# Patient Record
Sex: Female | Born: 1970 | Race: White | Hispanic: Yes | Marital: Single | Smoking: Never smoker
Health system: Southern US, Community
[De-identification: ages and names within clinical notes are randomized; demographics above are authoritative.]

## PROBLEM LIST (undated history)

## (undated) ENCOUNTER — Inpatient Hospital Stay (HOSPITAL_COMMUNITY): Payer: Self-pay

## (undated) DIAGNOSIS — I10 Essential (primary) hypertension: Secondary | ICD-10-CM

## (undated) DIAGNOSIS — O24419 Gestational diabetes mellitus in pregnancy, unspecified control: Secondary | ICD-10-CM

## (undated) DIAGNOSIS — N938 Other specified abnormal uterine and vaginal bleeding: Secondary | ICD-10-CM

## (undated) HISTORY — DX: Other specified abnormal uterine and vaginal bleeding: N93.8

## (undated) HISTORY — DX: Gestational diabetes mellitus in pregnancy, unspecified control: O24.419

---

## 2000-07-19 ENCOUNTER — Other Ambulatory Visit: Admission: RE | Admit: 2000-07-19 | Discharge: 2000-07-19 | Payer: Self-pay | Admitting: Gynecology

## 2000-12-13 ENCOUNTER — Emergency Department (HOSPITAL_COMMUNITY): Admission: EM | Admit: 2000-12-13 | Discharge: 2000-12-13 | Payer: Self-pay | Admitting: Emergency Medicine

## 2001-04-02 ENCOUNTER — Encounter: Payer: Self-pay | Admitting: Emergency Medicine

## 2001-04-02 ENCOUNTER — Emergency Department (HOSPITAL_COMMUNITY): Admission: EM | Admit: 2001-04-02 | Discharge: 2001-04-02 | Payer: Self-pay | Admitting: Emergency Medicine

## 2001-11-19 ENCOUNTER — Ambulatory Visit (HOSPITAL_COMMUNITY): Admission: RE | Admit: 2001-11-19 | Discharge: 2001-11-19 | Payer: Self-pay | Admitting: *Deleted

## 2001-11-19 ENCOUNTER — Encounter: Payer: Self-pay | Admitting: *Deleted

## 2002-01-03 ENCOUNTER — Ambulatory Visit (HOSPITAL_COMMUNITY): Admission: RE | Admit: 2002-01-03 | Discharge: 2002-01-03 | Payer: Self-pay | Admitting: *Deleted

## 2002-01-03 ENCOUNTER — Encounter: Payer: Self-pay | Admitting: *Deleted

## 2002-02-20 ENCOUNTER — Inpatient Hospital Stay (HOSPITAL_COMMUNITY): Admission: AD | Admit: 2002-02-20 | Discharge: 2002-02-20 | Payer: Self-pay | Admitting: *Deleted

## 2002-02-21 ENCOUNTER — Encounter: Payer: Self-pay | Admitting: *Deleted

## 2002-02-22 ENCOUNTER — Inpatient Hospital Stay (HOSPITAL_COMMUNITY): Admission: AD | Admit: 2002-02-22 | Discharge: 2002-02-25 | Payer: Self-pay | Admitting: *Deleted

## 2002-03-05 ENCOUNTER — Encounter: Admission: RE | Admit: 2002-03-05 | Discharge: 2002-03-05 | Payer: Self-pay | Admitting: *Deleted

## 2002-03-07 ENCOUNTER — Encounter: Admission: RE | Admit: 2002-03-07 | Discharge: 2002-03-07 | Payer: Self-pay | Admitting: *Deleted

## 2002-03-12 ENCOUNTER — Encounter: Admission: RE | Admit: 2002-03-12 | Discharge: 2002-03-12 | Payer: Self-pay | Admitting: *Deleted

## 2002-03-19 ENCOUNTER — Encounter: Admission: RE | Admit: 2002-03-19 | Discharge: 2002-03-19 | Payer: Self-pay | Admitting: *Deleted

## 2002-03-24 ENCOUNTER — Ambulatory Visit (HOSPITAL_COMMUNITY): Admission: RE | Admit: 2002-03-24 | Discharge: 2002-03-24 | Payer: Self-pay | Admitting: *Deleted

## 2002-03-26 ENCOUNTER — Encounter: Admission: RE | Admit: 2002-03-26 | Discharge: 2002-03-26 | Payer: Self-pay | Admitting: *Deleted

## 2002-04-02 ENCOUNTER — Encounter: Admission: RE | Admit: 2002-04-02 | Discharge: 2002-04-02 | Payer: Self-pay | Admitting: *Deleted

## 2002-04-09 ENCOUNTER — Encounter: Admission: RE | Admit: 2002-04-09 | Discharge: 2002-04-09 | Payer: Self-pay | Admitting: *Deleted

## 2002-04-16 ENCOUNTER — Encounter: Admission: RE | Admit: 2002-04-16 | Discharge: 2002-04-16 | Payer: Self-pay | Admitting: *Deleted

## 2002-04-23 ENCOUNTER — Encounter: Admission: RE | Admit: 2002-04-23 | Discharge: 2002-04-23 | Payer: Self-pay | Admitting: *Deleted

## 2002-04-30 ENCOUNTER — Encounter: Admission: RE | Admit: 2002-04-30 | Discharge: 2002-04-30 | Payer: Self-pay | Admitting: *Deleted

## 2002-05-07 ENCOUNTER — Encounter: Admission: RE | Admit: 2002-05-07 | Discharge: 2002-05-07 | Payer: Self-pay | Admitting: *Deleted

## 2002-05-13 ENCOUNTER — Inpatient Hospital Stay (HOSPITAL_COMMUNITY): Admission: AD | Admit: 2002-05-13 | Discharge: 2002-05-16 | Payer: Self-pay | Admitting: *Deleted

## 2004-08-07 ENCOUNTER — Inpatient Hospital Stay (HOSPITAL_COMMUNITY): Admission: AD | Admit: 2004-08-07 | Discharge: 2004-08-07 | Payer: Self-pay | Admitting: Family Medicine

## 2006-01-07 ENCOUNTER — Emergency Department (HOSPITAL_COMMUNITY): Admission: EM | Admit: 2006-01-07 | Discharge: 2006-01-07 | Payer: Self-pay

## 2006-01-08 ENCOUNTER — Emergency Department (HOSPITAL_COMMUNITY): Admission: EM | Admit: 2006-01-08 | Discharge: 2006-01-08 | Payer: Self-pay | Admitting: Emergency Medicine

## 2007-08-18 ENCOUNTER — Emergency Department (HOSPITAL_COMMUNITY): Admission: EM | Admit: 2007-08-18 | Discharge: 2007-08-18 | Payer: Self-pay | Admitting: Emergency Medicine

## 2007-08-20 ENCOUNTER — Inpatient Hospital Stay (HOSPITAL_COMMUNITY): Admission: AD | Admit: 2007-08-20 | Discharge: 2007-08-20 | Payer: Self-pay | Admitting: Obstetrics and Gynecology

## 2007-12-06 ENCOUNTER — Ambulatory Visit: Payer: Self-pay | Admitting: Internal Medicine

## 2007-12-09 ENCOUNTER — Ambulatory Visit: Payer: Self-pay | Admitting: *Deleted

## 2008-01-09 ENCOUNTER — Encounter: Payer: Self-pay | Admitting: Family Medicine

## 2008-01-09 ENCOUNTER — Ambulatory Visit: Payer: Self-pay | Admitting: Internal Medicine

## 2008-01-09 LAB — CONVERTED CEMR LAB
ALT: 18 units/L (ref 0–35)
Albumin: 4.2 g/dL (ref 3.5–5.2)
Alkaline Phosphatase: 64 units/L (ref 39–117)
BUN: 14 mg/dL (ref 6–23)
Basophils Absolute: 0 10*3/uL (ref 0.0–0.1)
CO2: 25 meq/L (ref 19–32)
Calcium: 9.1 mg/dL (ref 8.4–10.5)
Cholesterol: 189 mg/dL (ref 0–200)
Creatinine, Ser: 0.54 mg/dL (ref 0.40–1.20)
Eosinophils Relative: 2 % (ref 0–5)
Glucose, Bld: 101 mg/dL — ABNORMAL HIGH (ref 70–99)
Lymphs Abs: 2 10*3/uL (ref 0.7–4.0)
Monocytes Relative: 6 % (ref 3–12)
Neutro Abs: 3.4 10*3/uL (ref 1.7–7.7)
Neutrophils Relative %: 58 % (ref 43–77)
Potassium: 4.2 meq/L (ref 3.5–5.3)
RBC: 4.52 M/uL (ref 3.87–5.11)
RDW: 13.6 % (ref 11.5–15.5)
TSH: 1.482 microintl units/mL (ref 0.350–5.50)
Total Bilirubin: 0.5 mg/dL (ref 0.3–1.2)
Total Protein: 8 g/dL (ref 6.0–8.3)

## 2008-01-17 ENCOUNTER — Ambulatory Visit: Payer: Self-pay | Admitting: Internal Medicine

## 2008-01-17 ENCOUNTER — Encounter: Payer: Self-pay | Admitting: Family Medicine

## 2008-01-17 LAB — CONVERTED CEMR LAB: Chlamydia, DNA Probe: NEGATIVE

## 2008-01-18 ENCOUNTER — Encounter: Payer: Self-pay | Admitting: Family Medicine

## 2008-02-19 ENCOUNTER — Ambulatory Visit: Payer: Self-pay | Admitting: Family Medicine

## 2008-02-19 ENCOUNTER — Ambulatory Visit: Payer: Self-pay | Admitting: Internal Medicine

## 2008-02-19 LAB — CONVERTED CEMR LAB: Progesterone: 0.8 ng/mL

## 2008-11-23 ENCOUNTER — Inpatient Hospital Stay (HOSPITAL_COMMUNITY): Admission: AD | Admit: 2008-11-23 | Discharge: 2008-11-23 | Payer: Self-pay | Admitting: Family Medicine

## 2009-01-18 IMAGING — US US OB TRANSVAGINAL MODIFY
1 series · 13 of 28 positions shown · non-contrast
Comparison: 08/07/2004

CLINICAL DATA: Abdominal pain. Bleeding.

OBSTETRICAL ULTRASOUND <14 WK TRANSABDOMINAL AND TRANSVAGINAL OB:
TECHNIQUE: Both transabdominal and transvaginal ultrasound examinations were
performed for complete evaluation of the gestation as well as the maternal
uterus, adnexal regions, and pelvic cul-de-sac.

[Series 1: unknown · 0.25mm/px · 13 of 38 slices shown]
[im 2/38]
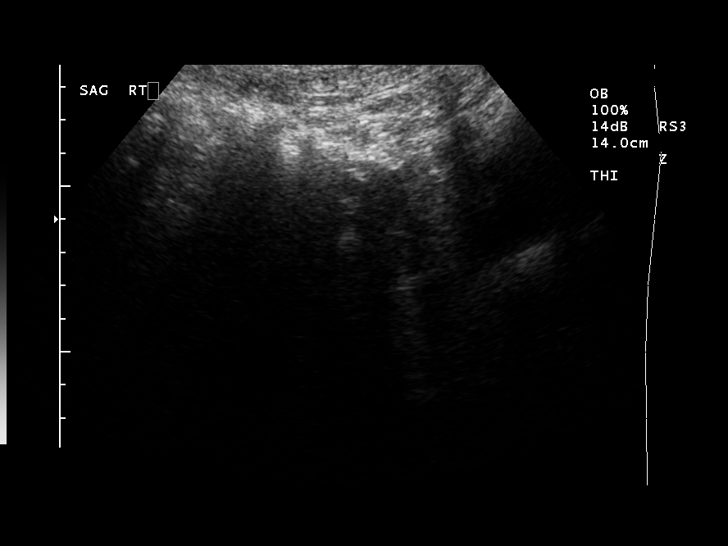
[im 5/38]
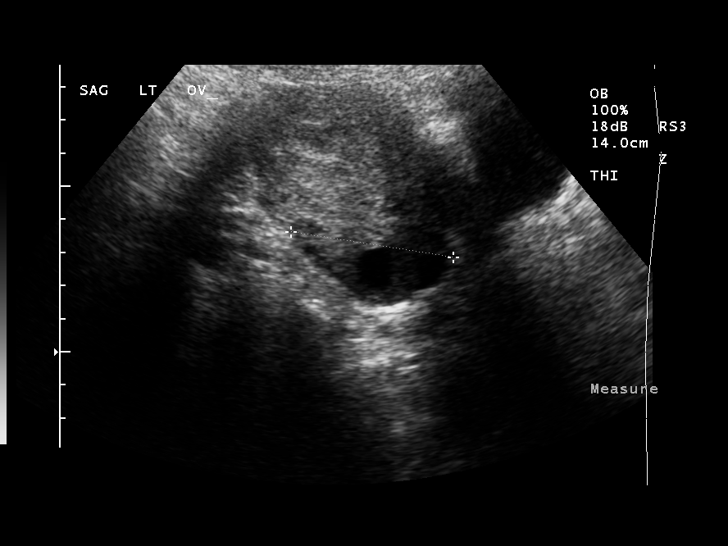
[im 7/38]
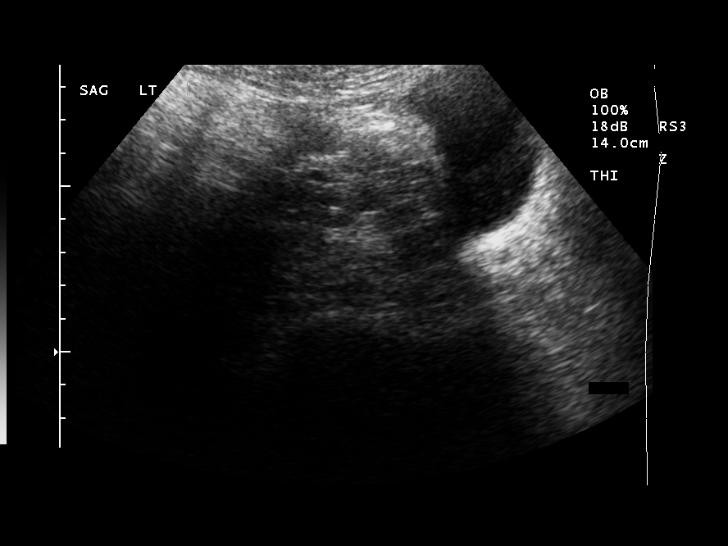
[im 10/38]
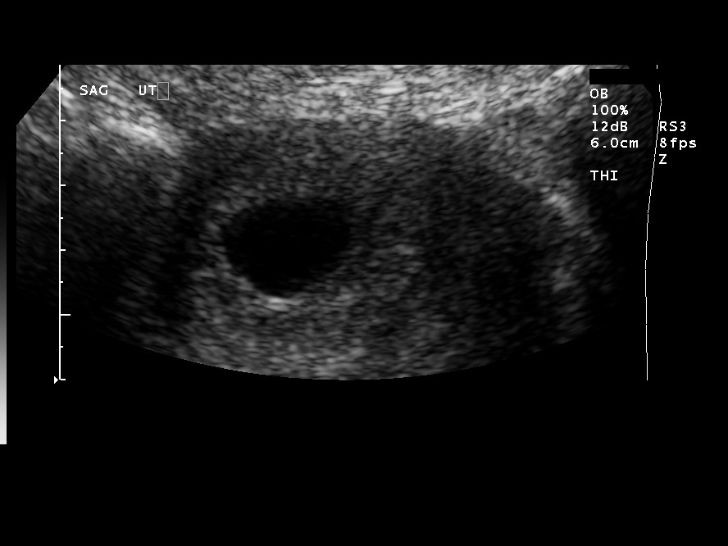
[im 13/38]
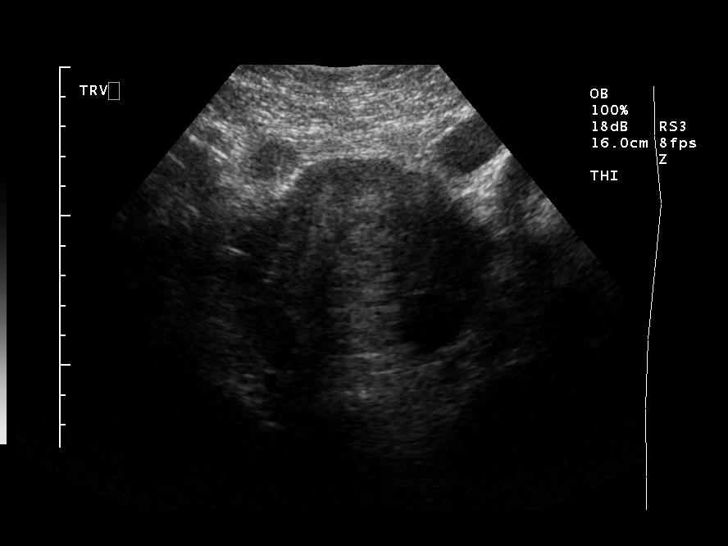
[im 16/38]
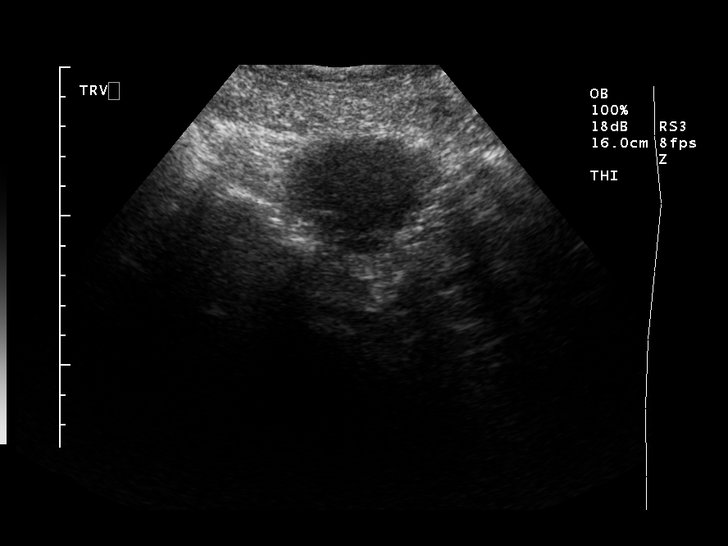
[im 20/38]
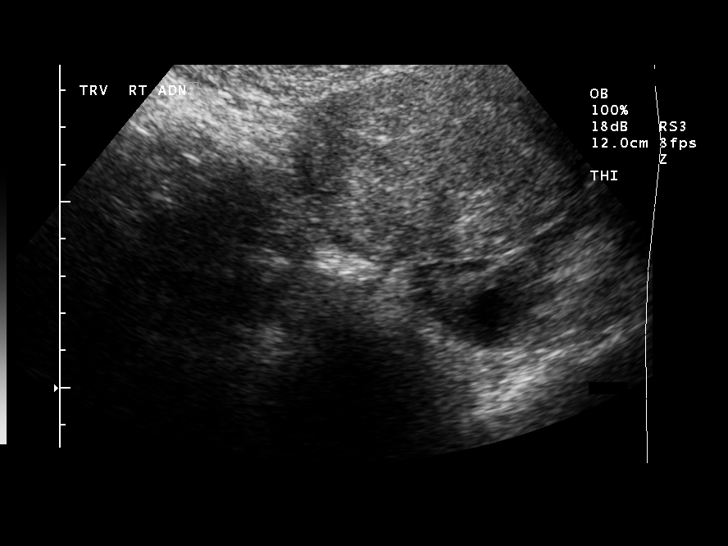
[im 22/38]
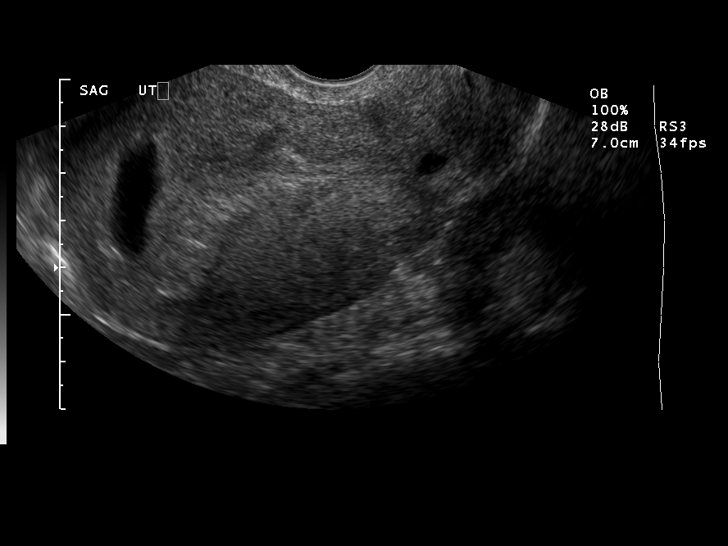
[im 25/38]
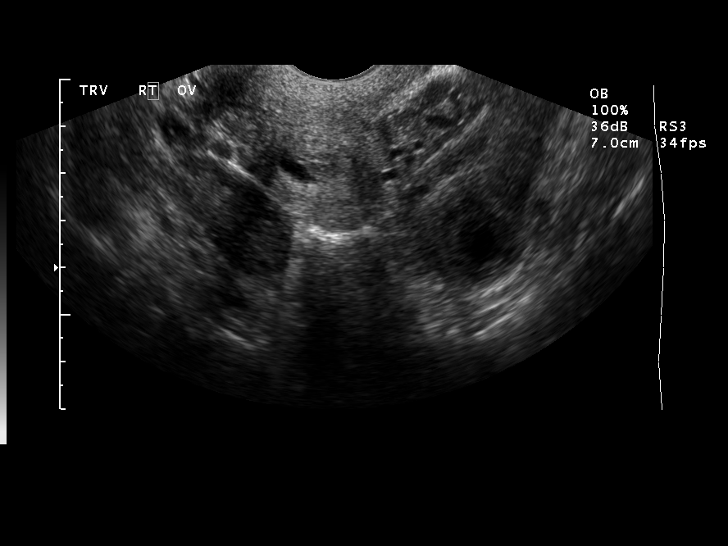
[im 28/38]
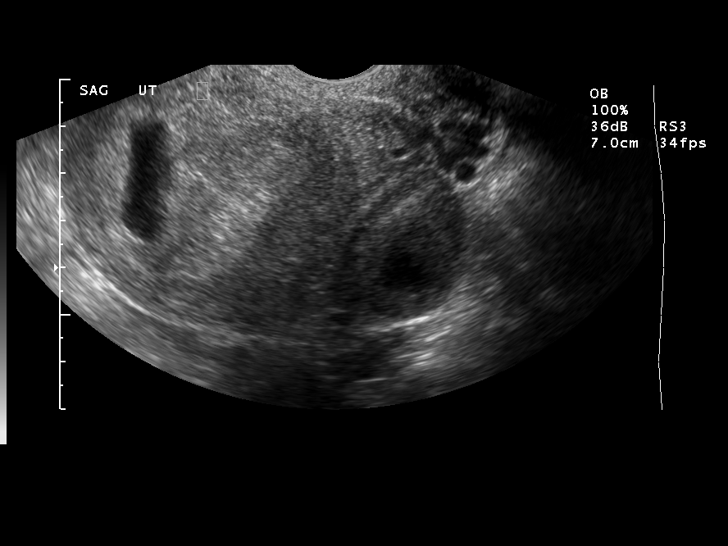
[im 31/38]
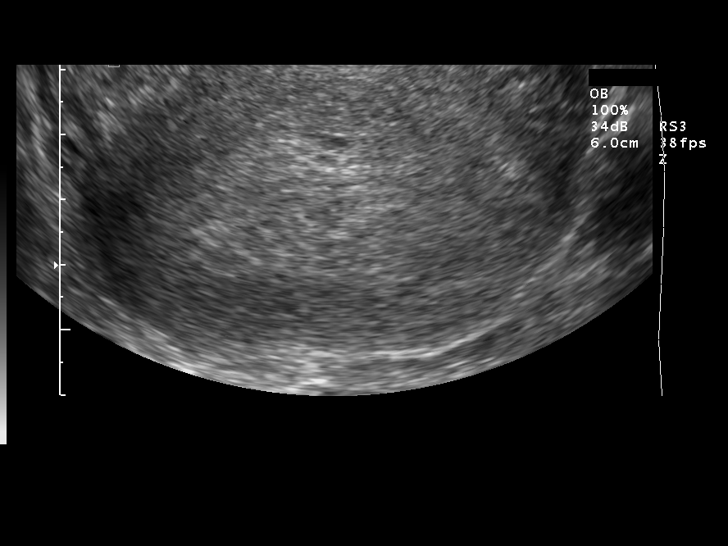
[im 33/38]
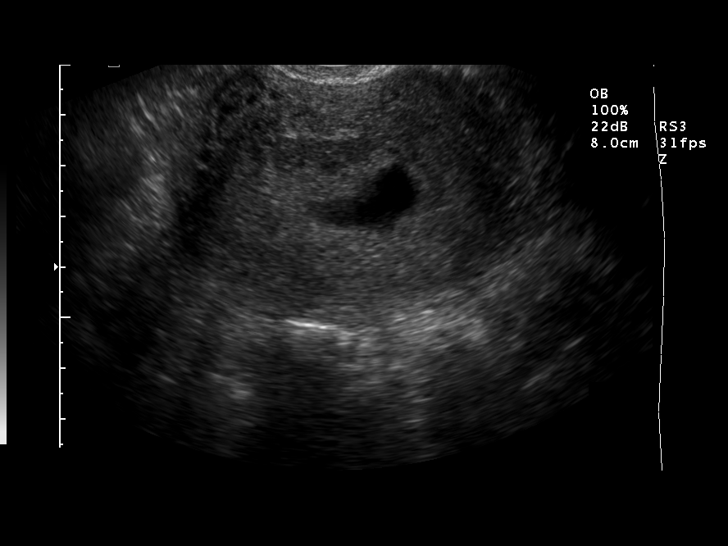
[im 36/38]
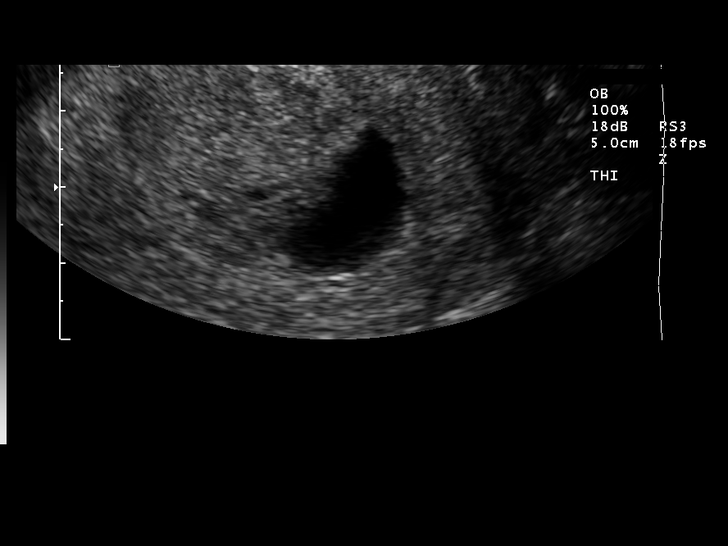

[13 of 28 positions shown; findings below may reference images not displayed]

FINDINGS: A fluid collection is identified within the uterus. This measures
mean of 23 mm. No evidence of yolk sac, embryo, or cardiac activity.

No subchorionic hemorrhage.

Right ovary normal in morphology. Left ovary demonstrates a 1.4 cm minimally
complex cystic lesion.

No significant free fluid.
IMPRESSION: 1.  23 millimeters mean diameter fluid collection within the endometrium. This
could represent a abnormal gestational sac or a incidental endometrial fluid
collection (pseudogestational sac).   This appearance is highly dependent on the
beta-hCG level, which is not submitted. If the beta-hCG level is greater than
4333, this is suspicious for either a spontaneous abortion or a
"pseudogestational sac" with concurrent ectopic pregnancy. If the beta-hCG level
is less than 4333, differential considerations would include a early IUP (with
the fluid being incidental), early ectopic, or spontaneous abortion.
2. Complex follicle versus small corpus luteal cyst in left ovary. 
3. no significant free fluid.
4. Recommend correlation with beta-hCG level and consider followup ultrasound
and/or beta-hCG.

## 2009-03-13 ENCOUNTER — Inpatient Hospital Stay (HOSPITAL_COMMUNITY): Admission: AD | Admit: 2009-03-13 | Discharge: 2009-03-14 | Payer: Self-pay | Admitting: Obstetrics

## 2009-03-18 ENCOUNTER — Inpatient Hospital Stay (HOSPITAL_COMMUNITY): Admission: AD | Admit: 2009-03-18 | Discharge: 2009-03-20 | Payer: Self-pay | Admitting: Obstetrics and Gynecology

## 2009-11-21 ENCOUNTER — Emergency Department (HOSPITAL_COMMUNITY): Admission: EM | Admit: 2009-11-21 | Discharge: 2009-11-21 | Payer: Self-pay | Admitting: Emergency Medicine

## 2010-12-04 LAB — URINALYSIS, ROUTINE W REFLEX MICROSCOPIC
Bilirubin Urine: NEGATIVE
Protein, ur: NEGATIVE mg/dL
pH: 6 (ref 5.0–8.0)

## 2010-12-04 LAB — POCT PREGNANCY, URINE: Preg Test, Ur: NEGATIVE

## 2010-12-18 LAB — CBC
HCT: 28.3 % — ABNORMAL LOW (ref 36.0–46.0)
HCT: 34.1 % — ABNORMAL LOW (ref 36.0–46.0)
Hemoglobin: 11.6 g/dL — ABNORMAL LOW (ref 12.0–15.0)
Hemoglobin: 9.8 g/dL — ABNORMAL LOW (ref 12.0–15.0)
MCHC: 34.1 g/dL (ref 30.0–36.0)
MCHC: 34.5 g/dL (ref 30.0–36.0)
MCV: 87.3 fL (ref 78.0–100.0)
MCV: 87.9 fL (ref 78.0–100.0)
Platelets: 243 10*3/uL (ref 150–400)
Platelets: 296 10*3/uL (ref 150–400)
RBC: 3.22 MIL/uL — ABNORMAL LOW (ref 3.87–5.11)
RBC: 3.91 MIL/uL (ref 3.87–5.11)
RDW: 14.2 % (ref 11.5–15.5)
RDW: 14.5 % (ref 11.5–15.5)
WBC: 7.9 10*3/uL (ref 4.0–10.5)
WBC: 9.7 10*3/uL (ref 4.0–10.5)

## 2010-12-18 LAB — RPR: RPR Ser Ql: NONREACTIVE

## 2010-12-18 LAB — CCBB MATERNAL DONOR DRAW

## 2010-12-22 LAB — HIV ANTIBODY (ROUTINE TESTING W REFLEX): HIV: NONREACTIVE

## 2010-12-22 LAB — RPR: RPR Ser Ql: NONREACTIVE

## 2010-12-22 LAB — URINALYSIS, ROUTINE W REFLEX MICROSCOPIC
Bilirubin Urine: NEGATIVE
Glucose, UA: NEGATIVE mg/dL
Hgb urine dipstick: NEGATIVE
Ketones, ur: NEGATIVE mg/dL
Protein, ur: NEGATIVE mg/dL
pH: 6.5 (ref 5.0–8.0)

## 2010-12-22 LAB — TYPE AND SCREEN
ABO/RH(D): B POS
Antibody Screen: NEGATIVE

## 2010-12-22 LAB — DIFFERENTIAL
Basophils Relative: 0 % (ref 0–1)
Eosinophils Relative: 0 % (ref 0–5)
Lymphocytes Relative: 16 % (ref 12–46)
Lymphs Abs: 1 10*3/uL (ref 0.7–4.0)
Monocytes Absolute: 0.3 10*3/uL (ref 0.1–1.0)
Neutrophils Relative %: 79 % — ABNORMAL HIGH (ref 43–77)

## 2010-12-22 LAB — CBC
HCT: 32.9 % — ABNORMAL LOW (ref 36.0–46.0)
Platelets: 302 10*3/uL (ref 150–400)
RDW: 13.1 % (ref 11.5–15.5)
WBC: 6.3 10*3/uL (ref 4.0–10.5)

## 2010-12-22 LAB — SICKLE CELL SCREEN: Sickle Cell Screen: NEGATIVE

## 2011-01-27 NOTE — Discharge Summary (Signed)
[facility]  Patient:    Elizabeth Mora, Elizabeth Mora Visit Number: [id number] MRN: [id number]          Service Type: OBS Location: [id number] Attending Physician:  Michaelle Copas Dictated by:   Conni Elliot, M.D. Admit Date:  02/21/2002 Discharge Date: 02/25/2002                             Discharge Summary  HISTORY OF PRESENT ILLNESS:  The patient is a 40 year old, gravida 1, para 0, at [redacted] weeks gestation.  The patient is dated by a 14 week ultrasound, giving her a due date of May 15, 2002.  Her last menstrual period is unavailable.  The patient had ultrasound findings on February 20, 2002, and the patient was asked to return for followup.  ADMISSION PHYSICAL EXAMINATION:  VITAL SIGNS:  Temperature 98, pulse 74, respiratory rate 18, blood pressure 121/74.  GENERAL:  In no acute distress.  HEART:  Normal sinus rhythm.  LUNGS:  Clear.  PELVIC:  Fundal height 31.  Cervical exam:  Soft, posterior, external os 1, internal fingertip to close, approximately 2.5 cm long, -3 vertex.  NEUROLOGIC:  Deep tendon reflexes are 1+.  HOSPITAL COURSE:  The patient was admitted with threatened preterm labor due to short cervix and possible gestational diabetes.  The patient was brought in for IV antibiotics and glycemic control.  The patient was begun on Procardia p.o. 10 mg q.8h.  The patient was placed on an ADA diet, and blood sugars were well controlled.  The patient was felt to be ready for discharge on p.o. Procardia and ADA diet.  FOLLOWUP:  The patient is to follow up on March 05, 2002.  DIET:  Continue her ADA diet.Dictated by:   Conni Elliot, M.D. Attending Physician:  Michaelle Copas DD:  03/25/02 TD:  03/28/02 Job: [id number]

## 2011-05-29 ENCOUNTER — Emergency Department (HOSPITAL_COMMUNITY)
Admission: EM | Admit: 2011-05-29 | Discharge: 2011-05-29 | Disposition: A | Discharge status: Home / Self Care | Payer: Self-pay | Attending: Emergency Medicine | Admitting: Emergency Medicine

## 2011-05-29 ENCOUNTER — Emergency Department (HOSPITAL_COMMUNITY): Payer: Self-pay

## 2011-05-29 DIAGNOSIS — R109 Unspecified abdominal pain: Secondary | ICD-10-CM | POA: Insufficient documentation

## 2011-05-29 DIAGNOSIS — N898 Other specified noninflammatory disorders of vagina: Secondary | ICD-10-CM | POA: Insufficient documentation

## 2011-05-29 DIAGNOSIS — R509 Fever, unspecified: Secondary | ICD-10-CM | POA: Insufficient documentation

## 2011-05-29 LAB — COMPREHENSIVE METABOLIC PANEL
AST: 10 U/L (ref 0–37)
Albumin: 4.4 g/dL (ref 3.5–5.2)
Calcium: 9.3 mg/dL (ref 8.4–10.5)
Chloride: 105 mEq/L (ref 96–112)
Creatinine, Ser: 0.51 mg/dL (ref 0.50–1.10)
Total Protein: 8.2 g/dL (ref 6.0–8.3)

## 2011-05-29 LAB — URINALYSIS, ROUTINE W REFLEX MICROSCOPIC
Bilirubin Urine: NEGATIVE
Glucose, UA: 100 mg/dL — AB
Nitrite: NEGATIVE
Specific Gravity, Urine: 1.026 (ref 1.005–1.030)
pH: 5 (ref 5.0–8.0)

## 2011-05-29 LAB — WET PREP, GENITAL
Trich, Wet Prep: NONE SEEN
Yeast Wet Prep HPF POC: NONE SEEN

## 2011-05-29 LAB — CBC
MCH: 29.8 pg (ref 26.0–34.0)
MCV: 86 fL (ref 78.0–100.0)
Platelets: 321 10*3/uL (ref 150–400)
RDW: 12.7 % (ref 11.5–15.5)
WBC: 6.2 10*3/uL (ref 4.0–10.5)

## 2011-05-29 LAB — DIFFERENTIAL
Eosinophils Absolute: 0 10*3/uL (ref 0.0–0.7)
Eosinophils Relative: 1 % (ref 0–5)
Lymphs Abs: 1.4 10*3/uL (ref 0.7–4.0)

## 2011-05-29 LAB — POCT PREGNANCY, URINE: Preg Test, Ur: NEGATIVE

## 2011-05-29 LAB — URINE MICROSCOPIC-ADD ON

## 2011-05-30 LAB — GC/CHLAMYDIA PROBE AMP, GENITAL: GC Probe Amp, Genital: NEGATIVE

## 2011-06-19 LAB — WET PREP, GENITAL
Trich, Wet Prep: NONE SEEN
Yeast Wet Prep HPF POC: NONE SEEN

## 2011-06-19 LAB — URINE MICROSCOPIC-ADD ON

## 2011-06-19 LAB — URINALYSIS, ROUTINE W REFLEX MICROSCOPIC
Bilirubin Urine: NEGATIVE
Glucose, UA: NEGATIVE
Ketones, ur: NEGATIVE
Nitrite: NEGATIVE
Protein, ur: NEGATIVE
Specific Gravity, Urine: 1.026
Urobilinogen, UA: 0.2
pH: 6

## 2011-06-19 LAB — BASIC METABOLIC PANEL
BUN: 11
CO2: 24
Calcium: 9.3
Chloride: 108
Creatinine, Ser: 0.5
GFR calc Af Amer: >60
GFR calc non Af Amer: >60
Glucose, Bld: 124 — ABNORMAL HIGH
Potassium: 3.8
Sodium: 138

## 2011-06-19 LAB — CBC
HCT: 37.3
Hemoglobin: 12.6
MCHC: 33.7
MCV: 89.9
Platelets: 343
RBC: 4.15
RDW: 13.7
WBC: 7.3

## 2011-06-19 LAB — DIFFERENTIAL
Basophils Absolute: 0
Basophils Relative: 1
Eosinophils Absolute: 0.1 — ABNORMAL LOW
Eosinophils Relative: 2
Lymphocytes Relative: 30
Lymphs Abs: 2.2
Monocytes Absolute: 0.5
Monocytes Relative: 7
Neutro Abs: 4.4
Neutrophils Relative %: 61

## 2011-06-19 LAB — HCG, QUANTITATIVE, PREGNANCY: hCG, Beta Chain, Quant, S: 17653 — ABNORMAL HIGH

## 2011-06-19 LAB — GC/CHLAMYDIA PROBE AMP, GENITAL: GC Probe Amp, Genital: NEGATIVE

## 2011-06-29 ENCOUNTER — Ambulatory Visit (INDEPENDENT_AMBULATORY_CARE_PROVIDER_SITE_OTHER): Payer: Self-pay | Admitting: Advanced Practice Midwife

## 2011-06-29 ENCOUNTER — Encounter: Payer: Self-pay | Admitting: Advanced Practice Midwife

## 2011-06-29 ENCOUNTER — Other Ambulatory Visit (HOSPITAL_COMMUNITY)
Admission: RE | Admit: 2011-06-29 | Discharge: 2011-06-29 | Disposition: A | Discharge status: Home / Self Care | Payer: Self-pay | Source: Ambulatory Visit | Attending: Advanced Practice Midwife | Admitting: Advanced Practice Midwife

## 2011-06-29 DIAGNOSIS — N921 Excessive and frequent menstruation with irregular cycle: Secondary | ICD-10-CM

## 2011-06-29 DIAGNOSIS — N915 Oligomenorrhea, unspecified: Secondary | ICD-10-CM | POA: Insufficient documentation

## 2011-06-29 DIAGNOSIS — Z01419 Encounter for gynecological examination (general) (routine) without abnormal findings: Secondary | ICD-10-CM

## 2011-06-29 DIAGNOSIS — N920 Excessive and frequent menstruation with regular cycle: Secondary | ICD-10-CM

## 2011-06-29 LAB — POCT PREGNANCY, URINE: Preg Test, Ur: NEGATIVE

## 2011-06-29 MED ORDER — NORGESTIM-ETH ESTRAD TRIPHASIC 0.18/0.215/0.25 MG-25 MCG PO TABS: 1.0000 | ORAL_TABLET | Freq: Every day | ORAL | Status: DC

## 2011-06-29 NOTE — Patient Instructions (Signed)
Place abnormal uterine bleeding patient instructions here. Biopsia - Cuidados posteriores  Biopsy, Care After  Siga estas instrucciones durante las prximas semanas. Estas indicaciones le proporcionan informacin general acerca de cmo deber cuidarse despus del procedimiento. El mdico tambin podr darle instrucciones especficas. El tratamiento ha sido planificado segn las prcticas mdicas actuales, pero en algunos casos pueden ocurrir problemas. Comunquese con el mdico si tiene algn problema o tiene preguntas despus del procedimiento. Si le han realizado una biopsia con aguja fina, podrn sentir dolor en el sitio durante 1  2 das. Si le han realizado una biopsia abierta, podrn sentir dolor en el sitio durante 3  4 das.  INSTRUCCIONES PARA EL CUIDADO EN EL HOGAR   Puede reanudar su dieta y sus actividades normales segn se le haya indicado.   Cambie el vendaje tal como se le indic. Si la herida fue cerrada con pegamento para la piel Turner Daniels), comenzar a desprenderse luego de 7 das   Solo tome medicamentos que se pueden comprar sin Engineer, drilling o recetados para Chief Technology Officer, Dentist o fiebre, como le indica el mdico.   Consulte a su mdico cundo puede baarse y English as a second language teacher la herida.  SOLICITE ATENCIN MDICA DE INMEDIATO SI:   Aumenta el sangrado (ms de una pequea mancha) en el lugar de la biopsia.   Presenta enrojecimiento, hinchazn o aumento del dolor en la herida.   Tiene pus en el sitio de la biopsia.   Tiene fiebre.   Advierte un olor ftido que proviene del sitio de la biopsia o del vendaje.   Presenta una erupcin cutnea, siente dificultad para respirar o tiene algn otro problema de alergia.  ASEGRESE DE QUE:   Comprende estas instrucciones.   Controlar su enfermedad.   Solicitar ayuda de inmediato si no mejora o si empeora.  Document Released: 05/02/2011 Document Revised: 05/10/2011 [facility] Patient Information 2012 [place], [place].

## 2011-06-29 NOTE — Progress Notes (Signed)
  Subjective:    Patient ID: Elizabeth Mora, female    DOB: 06-12-1971, 40 y.o.   MRN: [id number]  HPI: Oligomenorrhea since menarche, approximately 2 cycles per year. New onset menorrhagia 05/29/11. Seen in ED for menorrhagia and abd pain (resolved). CT normal except for few diverticula. No US done. GC/CT, wet prep neg.  Lab Results  Component Value Date   WBC 6.2 05/29/2011   HGB 13.4 05/29/2011   HCT 38.6 05/29/2011   MCV 86.0 05/29/2011   PLT 321 05/29/2011     Here today for concern over new onset menorrhagia desire for cycle regulation. Last Pap ~ 1-2 years ago per pt.  Review of Systems: Otherwise neg. Denies intermenstrual spotting.     Objective:   Physical Exam:  Abd: soft, NT, BS + x 4 Pelvic: No inguinal nodes palpated. BUS normal, vaginal pink, well-rugated, no lesions. Cervix rliable, no discharge. Uterus normal size, NT, no CMT or adnexal masses.  Procedure: Endometrial Biopsy  Patient given informed consent, signed copy in the chart, time out was performed. Appropriate time out taken. . The patient was placed in the lithotomy position and the cervix brought into view with sterile speculum.  Portio of cervix cleansed x 2 with betadine swabs.  A tenaculum was placed in the anterior lip of the cervix.  The uterus was sounded for depth of 8 cm. A pipelle was introduced to into the uterus, suction created,  and an endometrial sample was obtained. All equipment was removed and accounted for.  The patient tolerated the procedure well. The was bleeding at the right insertion site of the tenaculum that resolved w/ application of silver nitrate. She declined ibuprofen after the procedure.    Patient given post procedure instructions. The patient will return in 3-4 weeks for results.     Assessment & Plan:  Assessment: 1. Oligomenorrhea w/ new-onset menorrhagia 2. Desires cycle control 3. Annual Gyn exam   1. Pap 2. Endometrial Biopsy 3. Will schedule pelvic US 4. F/U in  Gyn clinic for results in 3-4 weeks 5. May start OCPs for cycle regulation if biopsy results normal  Dorathy Kinsman 06/29/2011 5:53 PM

## 2011-06-29 NOTE — Progress Notes (Signed)
C/o irregular periods for a long time, went to [facility] ER 05/29/11 because heavy bleeding. C/o bad vaginal odor and yellowish vaginal discharge after intercourse

## 2011-07-05 ENCOUNTER — Other Ambulatory Visit: Payer: Self-pay | Admitting: Advanced Practice Midwife

## 2011-07-05 ENCOUNTER — Telehealth: Payer: Self-pay | Admitting: *Deleted

## 2011-07-05 DIAGNOSIS — N921 Excessive and frequent menstruation with irregular cycle: Secondary | ICD-10-CM

## 2011-07-05 DIAGNOSIS — N915 Oligomenorrhea, unspecified: Secondary | ICD-10-CM

## 2011-07-05 NOTE — Telephone Encounter (Signed)
Message copied by Mannie Stabile on Wed Jul 05, 2011  9:50 AM ------      Message from: Darrel Hoover      Created: Fri Jun 30, 2011  2:13 PM      Regarding: Please schedule                   ----- Message -----         From: Dorathy Kinsman, CNM         Sent: 06/30/2011   2:04 PM           To: Juliette Mangle, RN            Pt needs to schedule pelvic US for DUB, new onset of menorrhagia in next 1-2 weeks and F/U results visit for [place], Pap and endo Bx in ~ 4 weeks.

## 2011-07-05 NOTE — Telephone Encounter (Signed)
Patient scheduled for ultrasound on 07/11/11 at 2:30 and an appt for results on 08/09/11 at 12:45 in clinic. Delorise Royals called patient and left her a message to return our call about appointments. Need to make patient aware of these appointments.

## 2011-07-10 NOTE — Telephone Encounter (Signed)
Called patient with Interpreter Albertina [place] and notified of appointment for [place] and Results. Pt. Voices understanding

## 2011-07-11 ENCOUNTER — Ambulatory Visit (HOSPITAL_COMMUNITY)
Admission: RE | Admit: 2011-07-11 | Discharge: 2011-07-11 | Disposition: A | Discharge status: Home / Self Care | Payer: Self-pay | Source: Ambulatory Visit | Attending: Advanced Practice Midwife | Admitting: Advanced Practice Midwife

## 2011-07-11 DIAGNOSIS — N915 Oligomenorrhea, unspecified: Secondary | ICD-10-CM

## 2011-07-11 DIAGNOSIS — N921 Excessive and frequent menstruation with irregular cycle: Secondary | ICD-10-CM

## 2011-07-11 DIAGNOSIS — N938 Other specified abnormal uterine and vaginal bleeding: Secondary | ICD-10-CM | POA: Insufficient documentation

## 2011-07-11 DIAGNOSIS — N949 Unspecified condition associated with female genital organs and menstrual cycle: Secondary | ICD-10-CM | POA: Insufficient documentation

## 2011-08-09 ENCOUNTER — Encounter: Payer: Self-pay | Admitting: Physician Assistant

## 2011-08-09 ENCOUNTER — Ambulatory Visit (INDEPENDENT_AMBULATORY_CARE_PROVIDER_SITE_OTHER): Payer: Self-pay | Admitting: Physician Assistant

## 2011-08-09 DIAGNOSIS — N926 Irregular menstruation, unspecified: Secondary | ICD-10-CM

## 2011-08-09 NOTE — Progress Notes (Signed)
Chief Complaint:  Results   Elizabeth Mora is  40 y.o. G3P2010.  Patient's last menstrual period was 08/07/2011. Pt with no complaints. Reports regular periods on OCPs. Describes flow as medium to light lasting 3-5 days without cramping.  States desiring another pregnancy within this year. Reports "took a long time" to get pregnant with other children, ages 2 and 5 years. Requesting infertility assistance.    Obstetrical/Gynecological History: OB History    Grav Para Term Preterm Abortions TAB SAB Ect Mult Living   3 2 2  1  1          Past Medical History: Past Medical History  Diagnosis Date  . DUB (dysfunctional uterine bleeding)   . Gestational diabetes     Past Surgical History: No past surgical history on file.  Family History: No family history on file.  Social History: History  Substance Use Topics  . Smoking status: Never Smoker   . Smokeless tobacco: Never Used  . Alcohol Use: No    Allergies: No Known Allergies   (Not in a hospital admission)  Review of Systems - Negative except what has been reviewed in HPI  Physical Exam   Blood pressure 126/82, pulse 73, temperature 97.1 F (36.2 C), temperature source Oral, height 5\' 3"  (1.6 m), weight 150 lb 14.4 oz (68.448 kg), last menstrual period 08/07/2011.  General: General appearance - alert, well appearing, and in no distress, oriented to person, place, and time and normal appearing weight Mental status - alert, oriented to person, place, and time, normal mood, behavior, speech, dress, motor activity, and thought processes, affect appropriate to mood Focused Gynecological Exam: examination not indicated  Labs: NL endo Bx result  Imaging Studies:   US Pelvis Complete  07/11/2011  *RADIOLOGY REPORT*  Clinical Data: Dysfunctional uterine bleeding  TRANSABDOMINAL AND TRANSVAGINAL ULTRASOUND OF PELVIS Technique:  Both transabdominal and transvaginal ultrasound examinations of the pelvis were  performed. Transabdominal technique was performed for global imaging of the pelvis including uterus, ovaries, adnexal regions, and pelvic cul-de-sac.  Comparison: August 07, 2004   It was necessary to proceed with endovaginal exam following the transabdominal exam to visualize the endometrium.  Findings: The uterus is normal in size and echotexture, measuring 8.8 x 4.0 x 5.8 cm.  Endometrial stripe is thin and homogeneous, measuring 7 mm in width.  Both ovaries have a normal size and appearance.  The right ovary measures 3.6 x 2.1 x 2.2 cm, and the left ovary measures 5.0 x 2.2 x 2.5 cm.  There are no adnexal masses or free pelvic fluid.  IMPRESSION: Normal pelvic ultrasound.  Original Report Authenticated By: Brandon Melnick, M.D.     Assessment: Improved Status on OCPs Patient Active Problem List  Diagnoses  . Oligomenorrhea  . Menorrhagia with irregular cycle  Pregnancy Desired  Plan: Discussed pre-conception Stop OCPs if desiring pregnancy Recommend PNV daily If no conception in 3-6 months after stopping pills RTC or referral to repro-endo for evaluation.  Kao Berkheimer E. 08/09/2011,1:43 PM

## 2011-08-09 NOTE — Patient Instructions (Signed)
Infertilidad (Infertility) QU ES LA INFERTILIDAD?  La infertilidad normalmente se define como la incapacidad para quedar embarazada luego de un ao de relaciones sexuales regulares sin la utilizacin de mtodos anticonceptivos. O la incapacidad de llevar a trmino Chartered loss adjuster y Warehouse manager el beb. La tasa de infertilidad en los [place] es de alrededor del 10%. El [address] es el resultado de una cadena de sucesos. La mujer debe liberar el vulo de uno de sus ovarios (ovulacin). El vulo debe fertilizarse con el esperma. Luego viaja a travs de las trompas de [place] hacia el tero (matriz), donde se une a la pared del tero y crece. El hombre debe tener suficiente esperma y el esperma debe unirse con el vulo (fertilizar) en el momento justo. El vulo fertilizado debe luego unirse al interior del tero. Esto parece simple, pero pueden ocurrir [employer] cosas que evitan que el embarazo se produzca.  DE QUIN ES EL PROBLEMA?  Un 20% de los casos de infertilidad se deben a problemas del hombres (factores masculinos) y un 65% se debe a problemas de la mujer (factores femeninos). Otras causas pueden ser [place] combinacin de factores masculinos y femeninos o a causas desconocidas.  CULES SON LAS CAUSAS DE LA INFERTILIDAD EN EL HOMBRE?  La infertilidad en el hombre a menudo se debe a problemas para producir el esperma o hacer que el mismo llegue al vulo. Los problemas de esperma pueden existir desde el nacimiento o desarrollarse ms tarde debido a enfermedades o lesiones. Algunos hombres no producen esperma, o producen muy poco (oligospermia). Entre otros problemas se incluyen:  Disfuncin sexual   Problemas hormonales o endocrinos.   La edad. La fertilidad del hombre disminuye con la edad, pero no tan temprano como la femenina.   Infecciones.   Problemas congnitos Defectos de nacimiento, como ausencia de los tubos que transportan el esperma (conductos deferentes).   Problemas genticos o de  cromosomas.   Problemas de anticuerpos antiesperma.   Eyaculacin retrgrada (el esperma va hacia la vejiga).   Varicoceles, espermatoceles o tumores en los testculos.   El estilo de vida puede influir en el nmero y la calidad del esperma del hombre.   El alcohol y las drogas pueden reducir temporalmente la calidad del esperma.   Las toxinas [address], como los pesticidas y el plomo, pueden ocasionar algunos casos de infertilidad en hombres.  CULES SON LAS CAUSAS DE LA INFERTILIDAD EN LA MUJER?   La infertilidad en las mujeres est causada mayormente por problemas con la ovulacin. Sin ovulacin, el vulo no puede fertilizarse.   Seales de problemas de ovulacin son perodos menstruales irregulares o ningn perodo.   Factores simples del estilo de vida, como el estrs, la dieta, o el entrenamiento deportivo, pueden afectar el balance hormonal de Medical laboratory scientific officer.   La edad. La fertilidad comienza a IT consultant alrededor de los 30 aos y [place] a Glass blower/designer de los 37.   Mucho menos a menudo, un desequilibrio hormonal por un problema mdico serio como un tumor en la glndula pituitaria, tiroides u otra enfermedad mdica crnica pueden ocasionar problemas de ovulacin.   Infecciones plvicas.   Sndrome de ovarios poliqusticos (aumento de hormonas masculinas, incapacidad de ovular).   Consumo de alcohol o drogas.   Toxinas ambientales, radiacin, pesticidas y ciertos qumicos.   La edad es un factor importante en la infertilidad femenina.   La capacidad de los ovarios de una mujer para producir vulos disminuye con la edad, especialmente despus de los 35 aos.  Alrededor de un tercio de las parejas en las que la mujer tiene ms de 35 aos tendr problemas de infertilidad.   En el momento en el que alcanza la [place], cuando su perodo menstrual se detiene, la mujer ya no puede producir vulos ni quedar embarazada.   Otros problemas tambin pueden llevar a la  infertilidad en las mujeres. Si las trompas de [employer] estn bloqueadas en [employer] extremos, el vulo no puede pasar a travs de los tubos [place]. Tejido cicatrizal (adhesiones) en la pelvis que pueden obstruir las trompas. Esto puede dar como resultado una enfermedad inflamatoria plvica, endometriosis o una ciruga por embarazo ectpico (en la que el vulo fertilizado se ha implantado fuera del tero) o cualquier ciruga plvica o abdominal que ocasione adherencias.   Tumores fibroides o plipos en el tero.   Anormalidades congnitas (de nacimiento) del tero.   Infecciones en el cuello del tero (cervicitis).   Estenosis cervical (estrechamiento).   Mucosidad cervical anormal.   Sndrome poliqustico de los ovarios.   Tener relaciones sexuales muy seguidas (todos [place] o 4 a 5 veces por semana).   Obesidad.   Anorexia.   Dficit nutricional.   Mucho ejercicio, con prdida de grasa corporal.   DES. Su madre ha recibido la hormona dietilstilbesterol cuando estaba embarazada de usted.  CMO SE ANALIZA LA INFERTILIDAD?  Si ha estado tratando de quedar embarazada sin xito, podra querer buscar ayuda mdica. No debera esperar un ao de intentos sin xito antes de buscar ayuda profesional si:  Tiene mas de 35 aos de edad   Tiene razones para creer que podra tener problemas de fertilidad.  Un examen mdico determinar las causas de infertilidad de la pareja, Normalmente el proceso comienza con:  Exmenes fsicos   Historias clnicas de ambos miembros de la pareja.   Historiales sexuales de ambos miembros de la pareja.  Si no hay un problema evidente, como relaciones sexuales en tiempos inadecuados o ausencia de ovulacin, se necesitarn Academic librarian.   Para el hombre, los anlisis normalmente comienzan con pruebas de semen para observar:   La cantidad de esperma.   La forma del esperma.   El movimiento del esperma.   Realizar un historial clnico  y quirrgico completo.   Examen fsico.   Control de infecciones en los rganos reproductivos.  Podrn realizarle anlisis hormonales.   Para la mujer, el primer paso del anlisis es saber si ovula cada mes. Hay diferentes modos de Designer, industrial/product. Por ejemplo, puede llevar un registro de los cambios en la temperatura corporal por la maana y la textura de la mucosidad cervical. Otra herramienta es un kit de prueba de ovulacin casero, que puede comprarse en la farmacia.   Tambin pueden realizarse controles de ovulacin en el consultorio mdico, mediante anlisis de sangre para observar los niveles de hormonas o pruebas de [employer] ovarios. Si la mujer est ovulando, se necesitarn realizar ms exmenes. Algunas femeninas comunes incluyen:   Histerosalpingografa: Se realizan rayos x de las trompas de [place] y el tero con una inyeccin de [place]. Mostrar si las trompas estn abiertas y la forma del tero.   Laparoscopa: Un anlisis de las trompas y otros rganos femeninos para Copywriter, advertising. Se utiliza un tubo con iluminacin llamado laparoscopio para observar el interior del abdomen.   Biopsia endometrial: Se toma una muestra del tejido del tero Film/video editor del perodo menstrual, para ver si el tejido le indica si est ovulando.  Prueba de ultrasonido transvaginal: Examina los rganos femeninos.   Histeroscopa: Utiliza un tubo con iluminacin para examinar el cuello del tero y el tero y ver si hay anormalidades dentro del mismo.  TRATAMIENTO Dependiendo de los [employer], se sugerirn IT sales professional. El tratamiento depende de la causa. Entre el 85 y el 90% de los casos de infertilidad se tratan con drogas o [place].   Hay varias drogas para la fertilidad que pueden utilizarse para las mujeres con problemas de ovulacin. Es importante hablar con el profesional que la asiste sobre la droga a Chemical engineer. Deber comprender los beneficios y  efectos colaterales de las drogas. Segn el tipo de droga para la fertilidad y la dosis [place], algunas mujeres podran tener embarazos mltiples (mellizos).   De ser [employer], se puede realizar una ciruga para reparar daos en los ovarios de la [place], trompas de [place], el cuello del tero o el tero.   Tratamiento quirrgico o mdico para la endometriosis o el sndrome de ovario poliqustico. A veces, los problemas de fertilidad en el hombre pueden corregirse con medicamentos o [place].   Inseminacin intrauterina, IUI, de esperma en concordancia con la ovulacin.   Cambios en el estilo de vida, si esta es la causa (prdida de [place], aumento de ejercicios, dejar de fumar, beber excesivamente o tomar drogas ilegales).   Otros tipos de ciruga:   Extirpar tumores por dentro o sobre el tero.   Eliminar tejido cicatrizal de dentro del tero.   Arreglar trompas obstruidas.   Eliminar tejido cicatrizal en la pelvis y alrededor de los rganos femeninos.  QU ES LA TECNOLOGA DE REPRODUCCIN ASISTIDA (ART)?  La tecnologa de reproduccin asistida (ART) utiliza mtodos especiales para ayudar a parejas infrtiles. En esta tcnica se manipula tanto el vulo de la mujer como el esperma del hombre. El xito depende de muchos factores. La ART puede ser cara y [address]. Pero ha hecho posible que muchas parejas tengan hijos que de otra manera no hubieran podido. A continuacin se enumeran algunos mtodos:  Fertilizacin. In Vitro (FIV). In Vitro (IVF) es un procedimiento que se hizo famoso con el nacimiento en 1978 de [place], el primer "beb de probeta" del mundo. Se utiliza cuando las trompas de [employer] de la mujer estn bloqueadas o cuando el hombre tiene poco esperma. Se utiliza una droga para estimular los ovarios y producir mltiples vulos. Una vez maduros, los vulos se retiran y se Industrial/product designer en una placa de cultivo con el esperma del hombre para la fertilizacin. Luego de  aproximadamente 40 horas, los vulos se examinan para ver si han sido fertilizados por el esperma y se han dividido en clulas. Esos vulos fertilizados (embriones) se colocan luego en el tero de la mujer. Esto evita el paso por las trompas de [place].   La transferencia intrafalopiana de gametas (GIFT) es similar al IVF, pero se utiliza cuando la mujer tiene al menos una trompa de falopio normal. Se colocan de tres a cinco vulos en la trompa de [place], junto con el esperma del hombre, para que la fertilizacin se realice dentro del cuerpo de Architectural technologist.   La transferencia intrafalopiana de cigotos (ZIFT), tambin se denomina transferencia embrionaria, y [place] el IVF con el GIFT. Los vulos retirados de los ovarios de la mujer se Land laboratorio y se Industrial/product designer en las trompas de [employer] en lugar de en el tero.   Los procedimientos de ART a menudo suponen la utilizacin de donantes de  vulos (de otra mujer) o de embriones congelados previamente. Los donantes de vulos pueden utilizarse si la mujer tiene [employer] ovarios o posee una enfermedad gentica que podra transmitirla al beb.   Cuando se realiza una ART hay mayor riesgo de embarazos mltiples, mellizos, trillizos o ms.   La inyeccin de esperma intracitoplasmtica es un procedimiento que inyecta un espermatozoide en el vulo para fertilizarlo.   El trasplante embrionario es un procedimiento que comienza con un embrin que se ha desarrollado en un medio especial (solucin qumica) preparado para mantener el embrin vivo por 2 a 5 das, y Conservation officer, historic buildings.  En los [employer] que no puede encontrarse la causa y Firefighter no se produce, podr considerarse la adopcin. Document Released: 09/17/2007 Document Revised: 05/10/2011 [facility] Patient Information 2012 [place], [place].Preparing for Pregnancy Preparing for pregnancy (preconceptual care) by getting counseling and information from your caregiver before  getting pregnant is a good idea. It will help you and your baby have a better chance to have a healthy, safe pregnancy and delivery of your baby. Make an appointment with your caregiver to talk about your health, medical, and family history and how to prepare yourself before getting pregnant. Your caregiver will do a complete physical exam and a Pap test. They will want to know:  About you, your spouse or partner, and your family's medical and genetic history.   If you are eating a balanced diet and drinking enough fluids.   What vitamins and mineral supplements you are taking. This includes taking folic acid before getting pregnant to help prevent birth defects.   What medications you are taking including prescription, over-the-counter and herbal medications.   If there is any substance abuse like alcohol, smoking, and illegal drugs.   If there is any mental or physical domestic violence.   If there is any risk of sexually transmitted disease between you and your partner.   What immunizations and vaccinations you have had and what you may need before getting pregnant.   If you should get tested for HIV infection.   If there is any exposure to chemical or toxic substances at home or work.   If there are medical problems you have that need to be treated and kept under control before getting pregnant such as diabetes, high blood pressure or others.   If there were any past surgeries, pregnancies and problems with them.   What your current weight is and to set a goal as to how much weight you should gain while pregnant. Also, they will check if you should lose or gain weight before getting pregnant.   What is your exercise routine and what it is safe when you are pregnant.   If there are any physical disabilities that need to be addressed.   About spacing your pregnancies when there are other children.   If there is a financial problem that may affect you having a child.  After  talking about the above points with your caregiver, your caregiver will give you advice on how to help treat and work with you on solving any issues, if necessary, before getting pregnant. The goal is to have a healthy and safe pregnancy for you and your baby. You should keep an accurate record of your menstrual periods because it will help in determining your due date. Immunizations that you should have before getting pregnant:   Regular measles, [place] measles (rubella) and mumps.   Tetanus and diphtheria.  Chickenpox, if not immune.   Herpes zoster (Varicella) if not immune.   Human papilloma virus vaccine (HPV) between the age of 66 and 40 years old.   Hepatitis A vaccine.   Hepatitis B vaccine.   Influenza vaccine.   Pneumococcal vaccine (pneumonia).  You should avoid getting pregnant for one month after getting vaccinated with a live virus vaccine such as [place] measles (rubella) vaccine. Other immunizations may be necessary depending on where you live, such as malaria. Ask your caregiver if any other immunizations are needed for you. HOME CARE INSTRUCTIONS   Follow the advice of your caregiver.   Before getting pregnant:   Begin taking vitamins, supplements, and 0.4 milligrams folic acid daily.   Get your immunizations up-to-date.   Get help from a nutrition counselor if you do not understand what a balanced diet is, need help with a special medical diet or if you need help to lose or gain weight.   Begin exercising.   Stop smoking, taking illegal drugs, and drinking alcoholic beverages.   Get counseling if there is and type of domestic violence.   Get checked for sexually transmitted diseases including HIV.   Get any medical problems under control (diabetes, high blood pressure, convulsions, asthma or others).   Resolve any financial concerns.   Be sure you and your spouse or partner are ready to have a baby.   Keep an accurate record of your menstrual periods.    Document Released: 08/10/2008 Document Revised: 05/10/2011 Document Reviewed: 08/10/2008 [facility] Patient Information 2012 [place], [place].

## 2011-09-12 NOTE — L&D Delivery Note (Cosign Needed)
Delivery Note   Elizabeth Mora is a 41 y.o. [id number] at [redacted]w[redacted]d (by 50 week sono) presenting in labor with cervix 10/+2. She had no prenatal care except for a visit to MAU 03/15/12. She was transferred to L&D and was feeling the urge to push. She pushed through 2 contractions and delivered the fetal head. A shoulder dystocia was called and the following maneuvers were attempted: McRoberts, suprapubic pressure, woodscrew, Rubin, reverse woodscrew and turning patient on side. Dr. Shawnie Pons was called to the room. Baby was finally delivered at 2 min 40 sec by delivery of posterior arm and shoulder.  At 4:26 AM a viable female was delivered via Vaginal, Spontaneous Delivery (vertex ; ROA ).  Pediatrician was called to room to evaluate baby. APGAR: 5, 9; weight 9 lb, 2.8 oz.   Placenta status: spontaneous, intact.  Cord: 3 vessels with the following complications: shoulder dystocia .  Cord pH: 7.22  Anesthesia:  None Episiotomy: None Lacerations: None Suture Repair: n/a Est. Blood Loss (mL): 200  Mom to postpartum.  Baby to nursery-stable.  Napoleon Form 07/01/2012, 4:44 AM

## 2012-03-15 ENCOUNTER — Inpatient Hospital Stay (HOSPITAL_COMMUNITY)
Admission: AD | Admit: 2012-03-15 | Discharge: 2012-03-16 | Disposition: A | Discharge status: Home / Self Care | Payer: Self-pay | Source: Ambulatory Visit | Attending: Obstetrics & Gynecology | Admitting: Obstetrics & Gynecology

## 2012-03-15 ENCOUNTER — Encounter (HOSPITAL_COMMUNITY): Payer: Self-pay | Admitting: *Deleted

## 2012-03-15 DIAGNOSIS — O0930 Supervision of pregnancy with insufficient antenatal care, unspecified trimester: Secondary | ICD-10-CM | POA: Insufficient documentation

## 2012-03-15 DIAGNOSIS — O209 Hemorrhage in early pregnancy, unspecified: Secondary | ICD-10-CM | POA: Insufficient documentation

## 2012-03-15 DIAGNOSIS — O4690 Antepartum hemorrhage, unspecified, unspecified trimester: Secondary | ICD-10-CM

## 2012-03-15 NOTE — MAU Note (Signed)
When I go to the BR and wipe I see pink on the tissue. No pain

## 2012-03-15 NOTE — MAU Provider Note (Signed)
History     CSN: [id number]  Arrival date and time: 03/15/12 2238   First Provider Initiated Contact with Patient 03/15/12 2349      Chief Complaint  Patient presents with  . Vaginal Bleeding   HPI  Pt is here with amenorrhea x 5 months; +spotting with wiping that started today.  No abdominal pain.  +preg test in February 2013.  +fetal movement x 6 wks.    Past Medical History  Diagnosis Date  . DUB (dysfunctional uterine bleeding)   . Gestational diabetes     No past surgical history on file.  No family history on file.  History  Substance Use Topics  . Smoking status: Never Smoker   . Smokeless tobacco: Never Used  . Alcohol Use: No    Allergies: No Known Allergies  No prescriptions prior to admission    Review of Systems  Genitourinary:       Vaginal bleeding  All other systems reviewed and are negative.   Physical Exam   Blood pressure 107/74, pulse 85, temperature 98.5 F (36.9 C), temperature source Oral, resp. rate 20, height 5\' 3"  (1.6 m), weight 71.759 kg (158 lb 3.2 oz), last menstrual period 08/07/2011.  Physical Exam  Constitutional: She is oriented to person, place, and time. She appears well-developed and well-nourished. No distress.  HENT:  Head: Normocephalic.  Neck: Normal range of motion. Neck supple.  Cardiovascular: Normal rate, regular rhythm and normal heart sounds.   Respiratory: Effort normal and breath sounds normal.  GI: Soft. There is no tenderness.       Fundal height - 25 cm  Genitourinary: No bleeding around the vagina.       Cervix - closed/thick/high  Neurological: She is alert and oriented to person, place, and time.  Skin: Skin is warm and dry.    MAU Course  Procedures  Results for orders placed during the hospital encounter of 03/15/12 (from the past 24 hour(s))  URINALYSIS, ROUTINE W REFLEX MICROSCOPIC     Status: Abnormal   Collection Time   03/15/12 11:41 PM      Component Value Range   Color, Urine YELLOW   YELLOW   APPearance CLEAR  CLEAR   Specific Gravity, Urine >1.030 (*) 1.005 - 1.030   pH 6.0  5.0 - 8.0   Glucose, UA 100 (*) NEGATIVE mg/dL   Hgb urine dipstick SMALL (*) NEGATIVE   Bilirubin Urine NEGATIVE  NEGATIVE   Ketones, ur NEGATIVE  NEGATIVE mg/dL   Protein, ur NEGATIVE  NEGATIVE mg/dL   Urobilinogen, UA 0.2  0.0 - 1.0 mg/dL   Nitrite NEGATIVE  NEGATIVE   Leukocytes, UA SMALL (*) NEGATIVE  URINE MICROSCOPIC-ADD ON     Status: Abnormal   Collection Time   03/15/12 11:41 PM      Component Value Range   Squamous Epithelial / LPF FEW (*) RARE   WBC, UA 0-2  <3 WBC/hpf   RBC / HPF 0-2  <3 RBC/hpf   Bacteria, UA FEW (*) RARE  CBC     Status: Abnormal   Collection Time   03/16/12 12:30 AM      Component Value Range   WBC 7.3  4.0 - 10.5 K/uL   RBC 3.48 (*) 3.87 - 5.11 MIL/uL   Hemoglobin 10.5 (*) 12.0 - 15.0 g/dL   HCT 40.9 (*) 81.1 - 91.4 %   MCV 88.8  78.0 - 100.0 fL   MCH 30.2  26.0 - 34.0 pg  MCHC 34.0  30.0 - 36.0 g/dL   RDW 16.1  09.6 - 04.5 %   Platelets 311  150 - 400 K/uL     Ultrasound: IMPRESSION:  Single live intrauterine pregnancy noted, with a biparietal  diameter of 5.7 cm, corresponding to a gestational age of [redacted] weeks  4 days. This reflects an estimated date of delivery of July 09, 2012.  Recommend followup with non-emergent complete OB 14+ wk US  examination for fetal biometric evaluation and anatomic survey if  not already performed.   Assessment and Plan  Bleeding During Pregnancy - Exam Normal No Prenatal Care  Plan: DC to home Call [facility] for an appoinment Urine Culture F/U prn   [facility] 03/15/2012, 11:51 PM

## 2012-03-16 ENCOUNTER — Inpatient Hospital Stay (HOSPITAL_COMMUNITY): Payer: Self-pay

## 2012-03-16 LAB — CBC
HCT: 30.9 % — ABNORMAL LOW (ref 36.0–46.0)
Hemoglobin: 10.5 g/dL — ABNORMAL LOW (ref 12.0–15.0)
MCH: 30.2 pg (ref 26.0–34.0)
MCHC: 34 g/dL (ref 30.0–36.0)
RDW: 12.6 % (ref 11.5–15.5)

## 2012-03-16 LAB — URINALYSIS, ROUTINE W REFLEX MICROSCOPIC
Glucose, UA: 100 mg/dL — AB
Ketones, ur: NEGATIVE mg/dL
Protein, ur: NEGATIVE mg/dL

## 2012-03-16 LAB — WET PREP, GENITAL

## 2012-03-16 LAB — URINE MICROSCOPIC-ADD ON

## 2012-03-16 NOTE — MAU Provider Note (Signed)
Attestation of Attending Supervision of Advanced Practitioner (CNM/NP): Evaluation and management procedures were performed by the Advanced Practitioner under my supervision and collaboration.  I have reviewed the Advanced Practitioner's note and chart, and I agree with the management and plan.  Jaynie Collins, M.D. 03/16/2012 7:36 AM

## 2012-03-18 LAB — URINE CULTURE: Colony Count: 75000

## 2012-03-18 LAB — GC/CHLAMYDIA PROBE AMP, GENITAL
Chlamydia, DNA Probe: NEGATIVE
GC Probe Amp, Genital: NEGATIVE

## 2012-03-20 ENCOUNTER — Other Ambulatory Visit: Payer: Self-pay | Admitting: Family

## 2012-03-20 MED ORDER — KEFLEX 500 MG PO CAPS: 500.0000 mg | ORAL_CAPSULE | Freq: Three times a day (TID) | ORAL | Status: AC

## 2012-07-01 ENCOUNTER — Inpatient Hospital Stay (HOSPITAL_COMMUNITY)
Admission: AD | Admit: 2012-07-01 | Discharge: 2012-07-02 | DRG: 775 | Disposition: A | Discharge status: Home / Self Care | Payer: Medicaid Other | Source: Ambulatory Visit | Attending: Family Medicine | Admitting: Family Medicine

## 2012-07-01 ENCOUNTER — Encounter (HOSPITAL_COMMUNITY): Payer: Self-pay | Admitting: Obstetrics

## 2012-07-01 DIAGNOSIS — O09529 Supervision of elderly multigravida, unspecified trimester: Secondary | ICD-10-CM | POA: Diagnosis present

## 2012-07-01 DIAGNOSIS — O0930 Supervision of pregnancy with insufficient antenatal care, unspecified trimester: Secondary | ICD-10-CM

## 2012-07-01 LAB — CBC
HCT: 33.4 % — ABNORMAL LOW (ref 36.0–46.0)
Hemoglobin: 10.9 g/dL — ABNORMAL LOW (ref 12.0–15.0)
MCH: 27.4 pg (ref 26.0–34.0)
MCHC: 32.6 g/dL (ref 30.0–36.0)
MCV: 83.9 fL (ref 78.0–100.0)
RDW: 14 % (ref 11.5–15.5)

## 2012-07-01 LAB — RPR: RPR Ser Ql: NONREACTIVE

## 2012-07-01 LAB — HEPATITIS B SURFACE ANTIGEN: Hepatitis B Surface Ag: NEGATIVE

## 2012-07-01 MED ORDER — ONDANSETRON HCL 4 MG/2ML IJ SOLN: 4.0000 mg | Freq: Four times a day (QID) | INTRAMUSCULAR | Status: DC | PRN

## 2012-07-01 MED ORDER — OXYTOCIN 40 UNITS IN LACTATED RINGERS INFUSION - SIMPLE MED: 62.5000 mL/h | INTRAVENOUS | Status: DC

## 2012-07-01 MED ORDER — LACTATED RINGERS IV SOLN: 500.0000 mL | INTRAVENOUS | Status: DC | PRN

## 2012-07-01 MED ORDER — PRENATAL MULTIVITAMIN CH
1.0000 | ORAL_TABLET | Freq: Every day | ORAL | Status: DC
  Administered 2012-07-01 – 2012-07-02 (×2): 1 via ORAL
  Filled 2012-07-01: qty 1

## 2012-07-01 MED ORDER — ZOLPIDEM TARTRATE 5 MG PO TABS: 5.0000 mg | ORAL_TABLET | Freq: Every evening | ORAL | Status: DC | PRN

## 2012-07-01 MED ORDER — OXYTOCIN 10 UNIT/ML IJ SOLN
INTRAMUSCULAR | Status: AC
  Administered 2012-07-01: 10 [IU] via INTRAMUSCULAR
  Filled 2012-07-01: qty 1

## 2012-07-01 MED ORDER — CITRIC ACID-SODIUM CITRATE 334-500 MG/5ML PO SOLN: 30.0000 mL | ORAL | Status: DC | PRN

## 2012-07-01 MED ORDER — ONDANSETRON HCL 4 MG/2ML IJ SOLN: 4.0000 mg | INTRAMUSCULAR | Status: DC | PRN

## 2012-07-01 MED ORDER — WITCH HAZEL-GLYCERIN EX PADS: 1.0000 "application " | MEDICATED_PAD | CUTANEOUS | Status: DC | PRN

## 2012-07-01 MED ORDER — IBUPROFEN 600 MG PO TABS: 600.0000 mg | ORAL_TABLET | Freq: Four times a day (QID) | ORAL | Status: DC | PRN

## 2012-07-01 MED ORDER — OXYTOCIN BOLUS FROM INFUSION
500.0000 mL | INTRAVENOUS | Status: DC
  Filled 2012-07-01 (×21): qty 500

## 2012-07-01 MED ORDER — OXYTOCIN 10 UNIT/ML IJ SOLN
10.0000 [IU] | Freq: Once | INTRAMUSCULAR | Status: AC
  Administered 2012-07-01: 10 [IU] via INTRAMUSCULAR

## 2012-07-01 MED ORDER — DIBUCAINE 1 % RE OINT: 1.0000 "application " | TOPICAL_OINTMENT | RECTAL | Status: DC | PRN

## 2012-07-01 MED ORDER — ONDANSETRON HCL 4 MG PO TABS: 4.0000 mg | ORAL_TABLET | ORAL | Status: DC | PRN

## 2012-07-01 MED ORDER — LACTATED RINGERS IV SOLN: INTRAVENOUS | Status: DC

## 2012-07-01 MED ORDER — OXYCODONE-ACETAMINOPHEN 5-325 MG PO TABS: 1.0000 | ORAL_TABLET | ORAL | Status: DC | PRN

## 2012-07-01 MED ORDER — TETANUS-DIPHTH-ACELL PERTUSSIS 5-2.5-18.5 LF-MCG/0.5 IM SUSP
0.5000 mL | Freq: Once | INTRAMUSCULAR | Status: AC
  Administered 2012-07-02: 0.5 mL via INTRAMUSCULAR
  Filled 2012-07-01: qty 0.5

## 2012-07-01 MED ORDER — LANOLIN HYDROUS EX OINT: TOPICAL_OINTMENT | CUTANEOUS | Status: DC | PRN

## 2012-07-01 MED ORDER — IBUPROFEN 600 MG PO TABS
600.0000 mg | ORAL_TABLET | Freq: Four times a day (QID) | ORAL | Status: DC
  Administered 2012-07-01 – 2012-07-02 (×7): 600 mg via ORAL
  Filled 2012-07-01 (×6): qty 1

## 2012-07-01 MED ORDER — SIMETHICONE 80 MG PO CHEW
80.0000 mg | CHEWABLE_TABLET | ORAL | Status: DC | PRN
  Administered 2012-07-01: 80 mg via ORAL

## 2012-07-01 MED ORDER — DIPHENHYDRAMINE HCL 25 MG PO CAPS: 25.0000 mg | ORAL_CAPSULE | Freq: Four times a day (QID) | ORAL | Status: DC | PRN

## 2012-07-01 MED ORDER — BENZOCAINE-MENTHOL 20-0.5 % EX AERO
1.0000 "application " | INHALATION_SPRAY | CUTANEOUS | Status: DC | PRN
  Administered 2012-07-01: 1 via TOPICAL
  Filled 2012-07-01: qty 56

## 2012-07-01 MED ORDER — LIDOCAINE HCL (PF) 1 % IJ SOLN: 30.0000 mL | INTRAMUSCULAR | Status: DC | PRN

## 2012-07-01 MED ORDER — SENNOSIDES-DOCUSATE SODIUM 8.6-50 MG PO TABS
2.0000 | ORAL_TABLET | Freq: Every day | ORAL | Status: DC
  Administered 2012-07-01: 2 via ORAL

## 2012-07-01 MED ORDER — LIDOCAINE HCL (PF) 1 % IJ SOLN
INTRAMUSCULAR | Status: AC
  Filled 2012-07-01: qty 30

## 2012-07-01 MED ORDER — ACETAMINOPHEN 325 MG PO TABS: 650.0000 mg | ORAL_TABLET | ORAL | Status: DC | PRN

## 2012-07-01 NOTE — H&P (Signed)
Elizabeth Mora is a 41 y.o. female [id number] at [redacted]w[redacted]d by 23 week ultrasound presenting for contractions starting yesterday morning. Rupture of membranes about 1.5 hours ago at home. The patient had no prenatal care except for a visit to [facility] on 03/15/12. She states that she has been very depressed since she found out she was pregnant. She has two children at home and is not living with baby's father. She denies suicidal ideation. She had similar depression with her first baby lasting until 4 months postpartum but has never been on antidepressants.   Both prior deliveries were vaginal. Hx GDM with first baby, 9 lb. Second baby weighed 9 lb. 12 oz. Pt denies any complications with these deliveries.   Maternal Medical History:  Reason for admission: Reason for admission: contractions.  Reason for Admission:   nauseaContractions: Onset was 13-24 hours ago.   Frequency: regular.   Perceived severity is strong.    Fetal activity: Perceived fetal activity is normal.   Last perceived fetal movement was within the past hour.      OB History    Grav Para Term Preterm Abortions TAB SAB Ect Mult Living   4 2 2  1  1   2      Past Medical History  Diagnosis Date  . DUB (dysfunctional uterine bleeding)   . Gestational diabetes    No past surgical history on file. Family History: family history is not on file. Social History:  reports that she has never smoked. She has never used smokeless tobacco. She reports that she does not drink alcohol or use illicit drugs.   Prenatal Transfer Tool  Maternal Diabetes: No  Had GDM first pregnancy, 2nd with 9 lb 12 oz baby, no testing this pregnancy. Genetic Screening: Declined Too late for care Maternal Ultrasounds/Referrals: Normal One sono at 23 weeks for dating. Fetal Ultrasounds or other Referrals:  None Maternal Substance Abuse:  No Significant Maternal Medications:  None Significant Maternal Lab Results:  None GBS unknown. Other Comments:  No  prenatal care.  Review of Systems  Constitutional: Negative for fever and chills.  Gastrointestinal: Negative for nausea and vomiting.  Genitourinary: Negative for dysuria.  Psychiatric/Behavioral: Positive for depression. Negative for suicidal ideas.    Dilation: 10 Effacement (%): 100 Station: +3 Exam by:: Thad Ranger MD Blood pressure 148/81, pulse 80, resp. rate 18, height 5' 3.5" (1.613 m), weight 78.472 kg (173 lb), last menstrual period 08/07/2011, SpO2 100.00%.  Maternal Exam:  Uterine Assessment: Contraction strength is firm.  Contraction duration is 45 seconds. Contraction frequency is regular.   Abdomen: Patient reports no abdominal tenderness. Fetal presentation: vertex  Introitus: Normal vulva. Normal vagina.  Pelvis: adequate for delivery.   Cervix: Cervix evaluated by digital exam.     Fetal Exam Fetal Monitor Review: Mode: ultrasound.   Baseline rate: 135.  Variability: moderate (6-25 bpm).   Pattern: accelerations present and no decelerations.       Physical Exam  Constitutional: She appears well-developed and well-nourished. No distress.  HENT:  Head: Normocephalic and atraumatic.  Eyes: Conjunctivae normal and EOM are normal.  Neck: Normal range of motion. Neck supple.  Cardiovascular: Normal rate, regular rhythm and normal heart sounds.   Respiratory: Effort normal and breath sounds normal. No respiratory distress.  GI: Soft. There is no rebound and no guarding.  Musculoskeletal: Normal range of motion. She exhibits no edema and no tenderness.  Neurological: She is alert.  Skin: Skin is warm and dry.    Prenatal labs:  ABO, Rh:   Antibody:   Rubella:   RPR:    HBsAg:    HIV:    GBS:     Assessment/Plan: 79 [id number] at [redacted]w[redacted]d by 23 week sono with active labor and SROM. Complete/+2 Transfer to L&D Anticipate SVD.   Napoleon Form 07/01/2012, 4:41 AM

## 2012-07-01 NOTE — MAU Note (Signed)
Per Dr Thad Ranger, okay to transport to 165,  Transported to 165 with Dr Thad Ranger.

## 2012-07-01 NOTE — Consult Note (Signed)
Neonatology Note:  Attendance at Code Apgar:   Our team responded to a Code Apgar call to room # 165 during vaginal delivery in progress, due to infant with shoulder dystocia times 2.5-3 minutes. The mother is a [id number] with no PNC who had arrived 10 minutes prior to this delivery. ROM occurred < 2 hours PTD and the fluid was clear.  At delivery, the baby did not cry spontaneously and was floppy. HR was good. Our team arrived just after the baby was placed on the radiant warmer, at which time the baby was floppy, dusky, and not crying yet, but with eyes open and good HR. We did bulb suctioning and gave vigorous stimulation, and the baby began to cry, with rapid improvement in color. Tone gradually improved, but was still decreased at 5 minutes. We continued to observe her; tone returned to normal at about 7 minutes of life and there were no apparent focal deficits. I could not feel any clavicular fracture, either. Ap 5/9.  I spoke with the mother in the DR, then transferred the baby to the Pediatrician's care. No prenatal labs currently available.  Doretha Sou, MD
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
## 2012-07-01 NOTE — MAU Note (Signed)
Contractions  

## 2012-07-01 NOTE — Progress Notes (Signed)
UR chart review completed.  

## 2012-07-01 NOTE — Progress Notes (Signed)
[facility] Department of Clinical Social Work Documentation of Interpretation   I assisted __Tedra CSW_________________ with interpretation of ____questions__________________ for this patient.

## 2012-07-02 LAB — CBC
HCT: 31.3 % — ABNORMAL LOW (ref 36.0–46.0)
MCH: 27.3 pg (ref 26.0–34.0)
MCHC: 32.3 g/dL (ref 30.0–36.0)
MCV: 84.6 fL (ref 78.0–100.0)
Platelets: 239 10*3/uL (ref 150–400)
RDW: 14.2 % (ref 11.5–15.5)

## 2012-07-02 MED ORDER — SERTRALINE HCL 25 MG PO TABS: 25.0000 mg | ORAL_TABLET | Freq: Every day | ORAL | Status: DC

## 2012-07-02 MED ORDER — SERTRALINE HCL 25 MG PO TABS
25.0000 mg | ORAL_TABLET | Freq: Once | ORAL | Status: DC
  Filled 2012-07-02: qty 1

## 2012-07-02 MED ORDER — MEDROXYPROGESTERONE ACETATE 150 MG/ML IM SUSP
150.0000 mg | Freq: Once | INTRAMUSCULAR | Status: AC
  Administered 2012-07-02: 150 mg via INTRAMUSCULAR
  Filled 2012-07-02: qty 1

## 2012-07-02 NOTE — Discharge Summary (Signed)
Obstetric Discharge Summary Reason for Admission: onset of labor Prenatal Procedures: none Intrapartum Procedures: spontaneous vaginal delivery Postpartum Procedures: none Complications-Operative and Postpartum: none Hemoglobin  Date Value Range Status  07/02/2012 10.1* 12.0 - 15.0 g/dL Final     HCT  Date Value Range Status  07/02/2012 31.3* 36.0 - 46.0 % Final    Physical Exam:  General: alert, cooperative, appears stated age and no distress Lochia: appropriate Uterine Fundus: firm  Discharge Diagnoses: Term Pregnancy-delivered  Discharge Information: Date: 07/02/2012 Activity: unrestricted Diet: routine Medications: PNV and Zoloft Condition: stable Instructions: refer to practice specific booklet Discharge to: home Follow-up Information    Follow up with [facility]. In 3 weeks.   Contact information:   [address] [place] [address] [phone]         Newborn Data: Live born female  Birth Weight: 9 lb 2.8 oz (4162 g) APGAR: 5, 9  Home with mother.  Tawnya Crook 07/02/2012, 7:54 AM

## 2012-07-03 NOTE — Progress Notes (Signed)
Sw provided pt with information to [employer] of the [place] and [place] [facility] and Wellness, agencies where this Sw made referrals.  Sw left another message with the Spanish speaking counselor at [facility] requesting a call back.  Sw instructed pt to contact interpreter, if she does not receive a call from either agency within a week.  Pt verbalized understanding and was appreciative of resources offered.

## 2012-07-03 NOTE — Discharge Summary (Signed)
Attestation of Attending Supervision of Advanced Practitioner (CNM/NP): Evaluation and management procedures were performed by the Advanced Practitioner under my supervision and collaboration.  I have reviewed the Advanced Practitioner's note and chart, and I agree with the management and plan.  Elizabeth Mora 07/03/2012 8:29 AM

## 2012-07-22 ENCOUNTER — Ambulatory Visit (INDEPENDENT_AMBULATORY_CARE_PROVIDER_SITE_OTHER): Payer: Self-pay | Admitting: Advanced Practice Midwife

## 2012-07-22 ENCOUNTER — Encounter: Payer: Self-pay | Admitting: Advanced Practice Midwife

## 2012-07-22 VITALS — BP 139/90 | HR 73 | Temp 98.1°F | Ht 63.0 in | Wt 151.7 lb

## 2012-07-22 DIAGNOSIS — F53 Postpartum depression: Secondary | ICD-10-CM

## 2012-07-22 DIAGNOSIS — F329 Major depressive disorder, single episode, unspecified: Secondary | ICD-10-CM

## 2012-07-22 DIAGNOSIS — F3289 Other specified depressive episodes: Secondary | ICD-10-CM

## 2012-07-22 DIAGNOSIS — O99345 Other mental disorders complicating the puerperium: Secondary | ICD-10-CM

## 2012-07-22 NOTE — Patient Instructions (Signed)
Depresin postparto y depresin puerperal  (Postpartum Depression and Baby Blues) El perodo del postparto comienza inmediatamente despus del nacimiento del beb. Durante este tiempo, a menudo hay mucha alegra y emocin. Tambin es un tiempo de cambios considerables en la vida de [place]. Independientemente de las veces que [employer] da a luz, cada nio trae nuevos desafos y se modifica la dinmica de la familia. No es inusual tener sentimientos de emocin acompaados de cambios confusos en los estados de nimo, las emociones y los pensamientos. Todas las [employer] estn en riesgo de desarrollar depresin posparto o depresin puerperal. Estos cambios en el estado de nimo pueden ocurrir inmediatamente despus de dar a luz, o muchos meses despus. La depresin puerperal o la depresin posparto puede ser leve o grave. Adems, se puede resolver rpidamente, o puede ser un trastorno que dure [place].  CAUSAS  Los niveles elevados de hormonas y su rpido descenso son la causa principal de la depresin posparto y la depresin puerperal. Hay un conjunto de hormonas que se modifican radicalmente durante y despus del Psychiatrist. Los estrgenos y la progesterona generalmente disminuyen inmediatamente luego del [place]. Los niveles de hormona tiroidea y el cortisol y esteroides tambin disminuyen rpidamente. Entre otros factores que juegan un papel importante en estos cambios se incluyen los eventos importantes de la vida y los factores genticos.  FACTORES DE RIESGO  Si tiene cualquiera de los siguientes riesgos para la depresin posparto o depresin puerperal, sepa cules son los sntomas a tener en cuenta durante el perodo del posparto. Los factores de riesgo que pueden aumentar la probabilidad de sufrir este trastorno son:   Antecedentes familiares o personales de depresin.  Haber sufrido depresin mientras estuvo embarazada.  Haber sufrido trastornos del estado de nimo en perodos premenstruales o con  el uso de anticonceptivos orales.  Haber sufrido ests excepcional durante la vida.  Tener conflictos maritales.  No tener una red de apoyo social.  Wilburt Finlay un beb con necesidades especiales.  Tener problemas de salud tales como diabetes. SNTOMAS  Los sntomas de la depresin puerperal son:   Electronics engineer en el estado de nimo, como ir desde la extrema felicidad a la extrema tristeza.  Disminucin de Cabin crew.  Dificultad para dormir.  Episodios de llanto, ganas de llorar.  Irritabilidad.  Ansiedad. Los sntomas de la depresion postparto generalmente comienzan dentro del primer mes despus de haber dado a luz. Estos sntomas son:   Dificultad para dormirse o somnolencia excesiva.  Marcada prdida de peso.  Agitacin.  Sentimientos de Haematologist.  Falta de inters en la actividad o la comida. La psicosis puerperal es una enfermedad muy preocupante y puede ser peligrosa. Afortunadamente, es un trastorno raro. Sufrir alguno de los siguientes sntomas es motivo de atencin mdica inmediata. Los sntomas de psicosis puerperal son:   Alucinaciones e ilusiones.  Conducta bizarra o desorganizada.  Confusin o desorientacin. DIAGNSTICO  El diagnstico se realiza por medio de la evaluacin de los sntomas. No hay pruebas mdicas o de laboratorio, que llevan a un diagnstico, pero existen varios cuestionarios que un mdico puede Chemical engineer para identificar la depresin posparto, la depresin o la psicosis puerperal. En algunos casos se utiliza una herramienta de evaluacin denominada [place] de Depresin Postnatal de [place] para diagnosticar este trastorno.  TRATAMIENTO  La depresin postparto por lo general desaparece por s sola en 1 a 2 semanas. El apoyo social es a menudo todo lo que se necesita. Debe ser alentada a dormir y Lawyer o  suficiente. Ocasionalmente, es posible que se le administren medicamentos para ayudarla a dormir.  La depresin posparto  requiere tratamiento, ya que puede durar varios meses o ms tiempo si no se trata. El [address] incluir terapia individual o de grupo, medicamentos o ambos para Radio producer frente a los Marine scientist, fisiolgicos y psicolgicos que pueden desempear un papel en la depresin. El ejercicio regular, una dieta saludable, el descanso y el apoyo social tambin puede ser muy recomendables.  La psicosis puerperal es ms grave y necesita tratamiento inmediato. A menudo es necesaria la hospitalizacin.  INSTRUCCIONES PARA EL CUIDADO EN EL HOGAR   Descanse todo lo que pueda. Tome una siesta mientras el beb duerme.  Haga ejercicios regularmente. Para algunas mujeres es beneficioso el yoga y las caminatas.  Consuma una dieta balanceada y nutritiva.  Haga pequeas cosas que disfrute hacer. Tome una taza de t, dese un bao de espuma, lea su revista favorita o escuche la msica que ms [employer].  Evite el alcohol.  Pida ayuda con las tareas [address], la cocina, las compras o [place]. No trate de hacer todo.  Hable con la gente ms cercana a usted acerca de cmo se siente. [place] de [address], los miembros de su familia, amigos u otras madres recientes.  Trate de tener pensamientos positivos. Piense en las cosas que le resultan gratificantes.  No pase mucho tiempo sola.  Tome slo la medicacin que le indic el profesional.  Cumpla con todos los controles del postparto.  Hable con su mdico si tiene preocupaciones. SOLICITE ATENCIN MDICA SI:  S[place]le [place] reaccin o problemas con su medicamento.  SOLICITE ATENCIN MDICA DE INMEDIATO SI:   Tiene ideas suicidas.  Siente que puede daar al beb o a Engineer, maintenance (IT). Document Released: 02/14/2008 Document Revised: 11/20/2011 [facility] Patient Information 2013 [place], [place]. Postpartum Depression and Baby Blues The postpartum period begins right after the birth of a baby. During this time, there is often a great amount of joy and  excitement. It is also a time of considerable changes in the life of the parent(s). Regardless of how many times a mother gives birth, each child brings new challenges and dynamics to the family. It is not unusual to have feelings of excitement accompanied by confusing shifts in moods, emotions, and thoughts. All mothers are at risk of developing postpartum depression or the "baby blues." These mood changes can occur right after giving birth, or they may occur many months after giving birth. The baby blues or postpartum depression can be mild or severe. Additionally, postpartum depression can resolve rather quickly, or it can be a long-term condition. CAUSES Elevated hormones and their rapid decline are thought to be a main cause of postpartum depression and the baby blues. There are a number of hormones that radically change during and after pregnancy. Estrogen and progesterone usually decrease immediately after delivering your baby. The level of thyroid hormone and various cortisol steroids also rapidly drop. Other factors that play a major role in these changes include major life events and genetics.  RISK FACTORS If you have any of the following risks for the baby blues or postpartum depression, know what symptoms to watch out for during the postpartum period. Risk factors that may increase the likelihood of getting the baby blues or postpartum depression include:  Havinga personal or family history of depression.  Having depression while being pregnant.  Having premenstrual or oral contraceptive-associated mood issues.  Having exceptional life stress.  Having marital conflict.  Lacking a social support network.  Having a baby with special needs.  Having health problems such as diabetes. SYMPTOMS Baby blues symptoms include:  Brief fluctuations in mood, such as going from extreme happiness to sadness.  Decreased concentration.  Difficulty sleeping.  Crying spells,  tearfulness.  Irritability.  Anxiety. Postpartum depression symptoms typically begin within the first month after giving birth. These symptoms include:  Difficulty sleeping or excessive sleepiness.  Marked weight loss.  Agitation.  Feelings of worthlessness.  Lack of interest in activity or food. Postpartum psychosis is a very concerning condition and can be dangerous. Fortunately, it is rare. Displaying any of the following symptoms is cause for immediate medical attention. Postpartum psychosis symptoms include:  Hallucinations and delusions.  Bizarre or disorganized behavior.  Confusion or disorientation. DIAGNOSIS  A diagnosis is made by an evaluation of your symptoms. There are no medical or lab tests that lead to a diagnosis, but there are various questionnaires that a caregiver may use to identify those with the baby blues, postpartum depression, or psychosis. Often times, a screening tool called the [place] Postnatal Depression Scale is used to diagnose depression in the postpartum period.  TREATMENT The baby blues usually goes away on its own in 1 to 2 weeks. Social support is often all that is needed. You should be encouraged to get adequate sleep and rest. Occasionally, you may be given medicines to help you sleep.  Postpartum depression requires treatment as it can last several months or longer if it is not treated. Treatment may include individual or group therapy, medicine, or both to address any social, physiological, and psychological factors that may play a role in the depression. Regular exercise, a healthy diet, rest, and social support may also be strongly recommended.  Postpartum psychosis is more serious and needs treatment right away. Hospitalization is often needed. HOME CARE INSTRUCTIONS  Get as much rest as you can. Nap when the baby sleeps.  Exercise regularly. Some women find yoga and walking to be beneficial.  Eat a balanced and nourishing diet.  Do  little things that you enjoy. Have a cup of tea, take a bubble bath, read your favorite magazine, or listen to your favorite music.  Avoid alcohol.  Ask for help with household chores, cooking, grocery shopping, or running errands as needed. Do not try to do everything.  Talk to people close to you about how you are feeling. Get support from your partner, family members, friends, or other new moms.  Try to stay positive in how you think. Think about the things you are grateful for.  Do not spend a lot of time alone.  Only take medicine as directed by your caregiver.  Keep all your postpartum appointments.  Let your caregiver know if you have any concerns. SEEK MEDICAL CARE IF: You are having a reaction or problems with your medicine. SEEK IMMEDIATE MEDICAL CARE IF:  You have suicidal feelings.  You feel you may harm the baby or someone else. Document Released: 06/01/2004 Document Revised: 11/20/2011 Document Reviewed: 07/04/2011 [facility] Patient Information 2013 [place], [place].

## 2012-07-22 NOTE — Progress Notes (Signed)
  Subjective:    Patient ID: Elizabeth Mora, female    DOB: [birth year], 41 y.o.   MRN: [id number]  HPI This is a 41 y.o. female who is about 3 weeks postpartum who presents for evaluation of postpartum depression. She was seen by [facility] SW and referred for counseling services. She was prescribed Zoloft but has not taken it because she worries about taking a medication with small children at home as she lives alone with them. Fears it will make her sleepy.   Is breastfeeding and adjusting well. Has been sad but her children give her hope and prevent her from having "bad feelings". Denies SI/HI.  Has appointment tomorrow with counselor.    Review of Systems Small lochia. No fever. Some depression.    Objective:   Physical Exam  Constitutional: She is oriented to person, place, and time. She appears well-developed and well-nourished. No distress.       Observed bonding with infant and other 2 girls  Cardiovascular: Normal rate.   Pulmonary/Chest: Effort normal.  Neurological: She is alert and oriented to person, place, and time.  Skin: Skin is warm and dry.  Psychiatric: She has a normal mood and affect. Her behavior is normal. Judgment and thought content normal.          Assessment & Plan:  A:  Three weeks postpartum       Postpartum depression vs chronic depression  P:  Urged to keep appointment for tomorrow      Did not insist on meds but I did explain that they are safe and effective      Return in 3 wks for PP appt and 2 hr Glucola

## 2012-08-02 ENCOUNTER — Ambulatory Visit: Payer: Self-pay | Admitting: Advanced Practice Midwife

## 2012-08-23 ENCOUNTER — Ambulatory Visit (INDEPENDENT_AMBULATORY_CARE_PROVIDER_SITE_OTHER): Payer: Self-pay | Admitting: Obstetrics & Gynecology

## 2012-08-23 ENCOUNTER — Encounter: Payer: Self-pay | Admitting: Obstetrics & Gynecology

## 2012-08-23 DIAGNOSIS — F329 Major depressive disorder, single episode, unspecified: Secondary | ICD-10-CM

## 2012-08-23 DIAGNOSIS — F53 Postpartum depression: Secondary | ICD-10-CM

## 2012-08-23 DIAGNOSIS — O99345 Other mental disorders complicating the puerperium: Secondary | ICD-10-CM

## 2012-08-23 DIAGNOSIS — O99814 Abnormal glucose complicating childbirth: Secondary | ICD-10-CM

## 2012-08-23 MED ORDER — SERTRALINE HCL 25 MG PO TABS: 25.0000 mg | ORAL_TABLET | Freq: Every day | ORAL | Status: DC

## 2012-08-23 MED ORDER — SERTRALINE HCL 50 MG PO TABS: 50.0000 mg | ORAL_TABLET | Freq: Every day | ORAL | Status: DC

## 2012-08-23 NOTE — Patient Instructions (Signed)

## 2012-08-23 NOTE — Progress Notes (Signed)
Subjective:     Patient ID: Elizabeth Mora, female   DOB: [birth year], 41 y.o.   MRN: [id number]  HPI Pt presents for her 6 weeks Post partum check.  She has a h/o PP depression but, has not started meds.  She is worried about 'getting addicted' to the meds.  She is on Depo Provera and c/o HA about 3x/week.    Review of Systems     Objective:   Physical ExamBP 145/99  Pulse 85  Temp 98.7 F (37.1 C)  Wt 152 lb 3.2 oz (69.037 kg)  Breastfeeding? Yes Abd: soft, NT, ND GU: EGBUS: no lesions Vagina: no blood in vault Cervix: no lesion; no mucopurulent d/c Uterus: small, mobile Adnexa: no masses; non tender  PP depression screen 22         Assessment:     Post partum check Post partum depression- pt agrees to take meds   H/o GDM- for 2 hour PP GTT today    Plan:     2 hr GTT Zoloft 25mg  q day x 2 weeks then  Zolfot 50mg  q day F/u 6 weeks or sooner prn Pt scheduled to have an appt with the [facility] center for counseling (prev seen 5 days prev)  Noor Witte L. Harraway-Smith, M.D., [place]

## 2012-08-23 NOTE — Addendum Note (Signed)
Addended by: Franchot Mimes on: 08/23/2012 11:07 AM   Modules accepted: Orders

## 2012-08-28 ENCOUNTER — Telehealth: Payer: Self-pay

## 2012-08-28 NOTE — Telephone Encounter (Signed)
Message copied by Faythe Casa on Wed Aug 28, 2012  2:32 PM ------      Message from: Willodean Rosenthal      Created: Mon Aug 26, 2012 12:07 PM       Pt with abnormal 2 hr GCT from 6 week PP check.  Please notify pt and refer to primary care for treatment.            Thx,      clh-S

## 2012-08-28 NOTE — Telephone Encounter (Signed)
Called pt with Spanish interpreter Elizabeth Mora and informed pt of her abnormal 2 hr and that she would need to be referred to a PCP for management.  Pt stated that she did not have a PCP.  I informed pt that I would fax over a referral to [facility] and they will send out history info that needs to be filled out and sent back to them and they will call with an appt. Pt stated understanding and did not have any other questions.

## 2012-10-04 ENCOUNTER — Ambulatory Visit: Payer: Self-pay | Admitting: Obstetrics & Gynecology

## 2012-10-29 IMAGING — CT CT ABD-PELV W/O CM
2 of 4 series · 14 of 32 positions shown, 19 images · non-contrast
Comparison: None.

CLINICAL DATA: Left flank pain

CT ABDOMEN AND PELVIS WITHOUT CONTRAST
TECHNIQUE: Multidetector CT imaging of the abdomen and pelvis was
performed following the standard protocol without intravenous
contrast.

[Series 2: stone <(id) >(id) · axial · 0.77mm/px · z∈[-425,-65]mm · 7 of 97 slices shown, 12 images]
[im 13/97  soft-tissue]
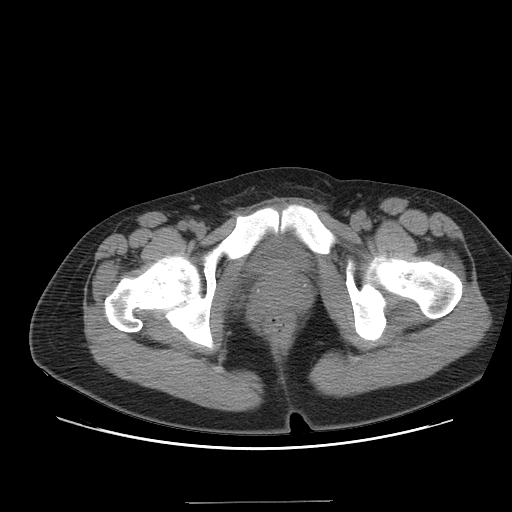
[im 13/97  bone]
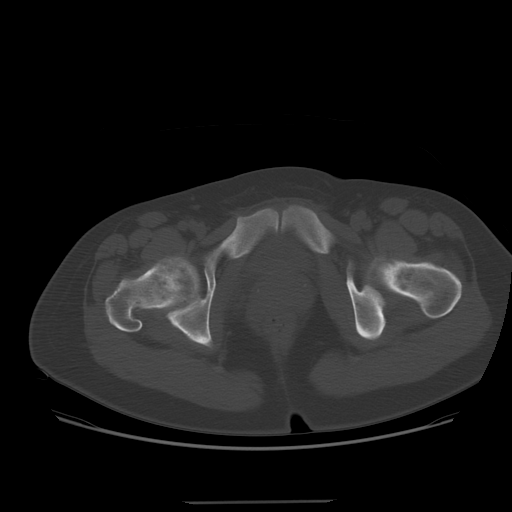
[im 25/97  soft-tissue]
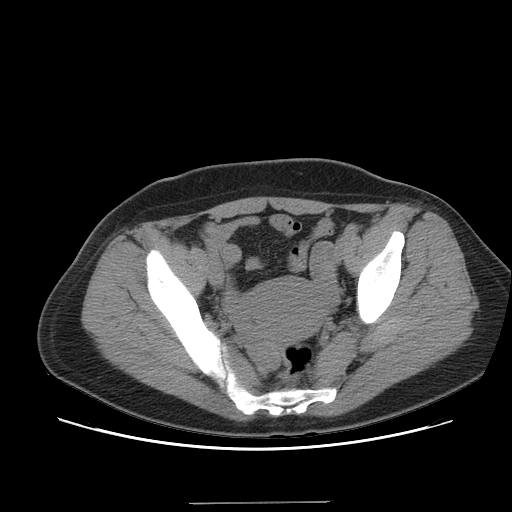
[im 37/97  soft-tissue]
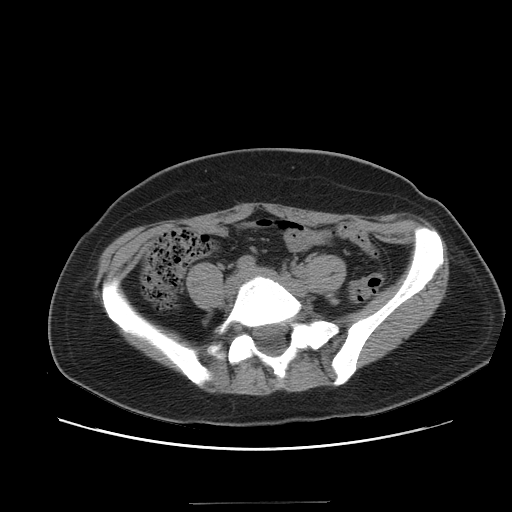
[im 49/97  soft-tissue]
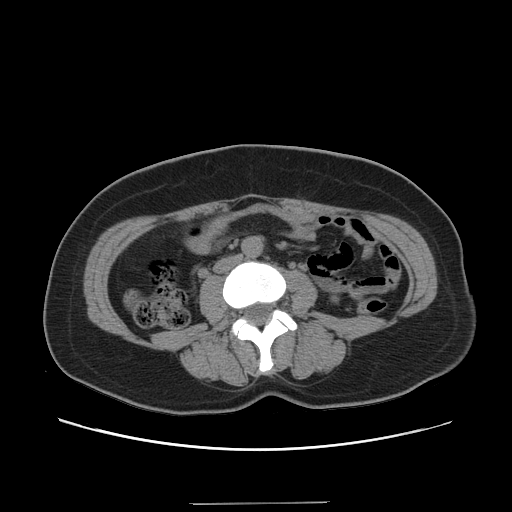
[im 49/97  lung]
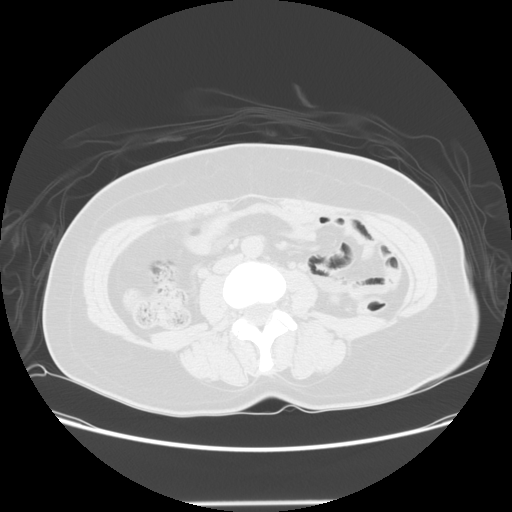
[im 61/97  soft-tissue]
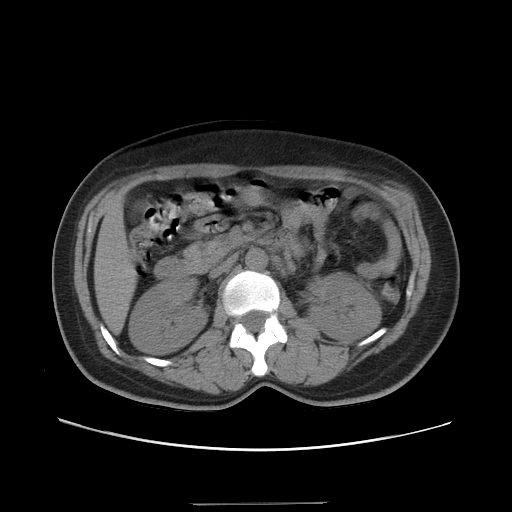
[im 61/97  lung]
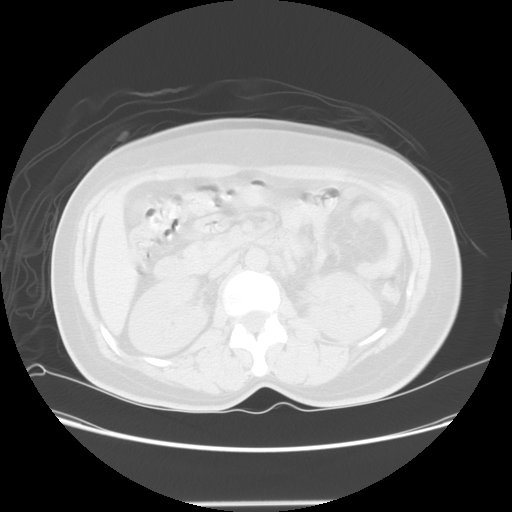
[im 73/97  soft-tissue]
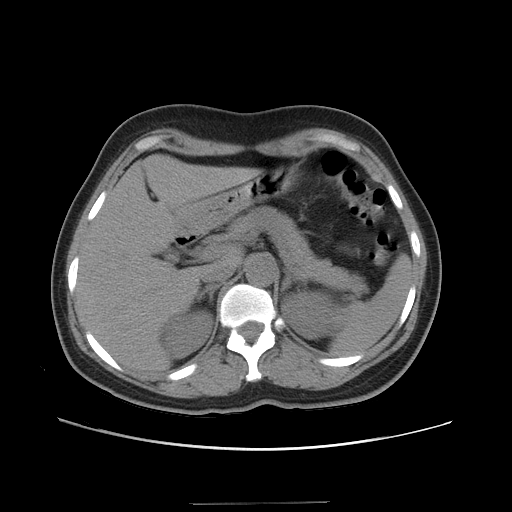
[im 73/97  lung]
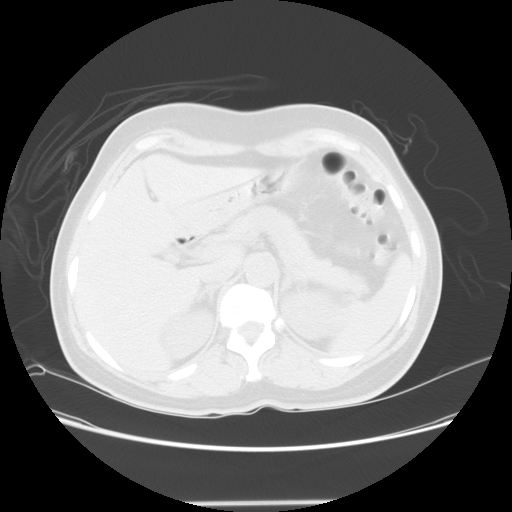
[im 85/97  soft-tissue]
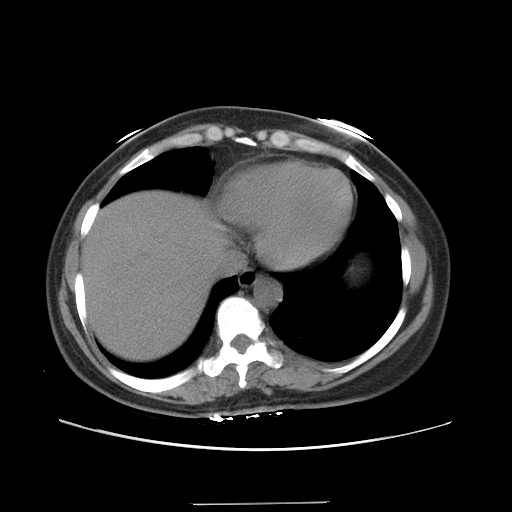
[im 85/97  lung]
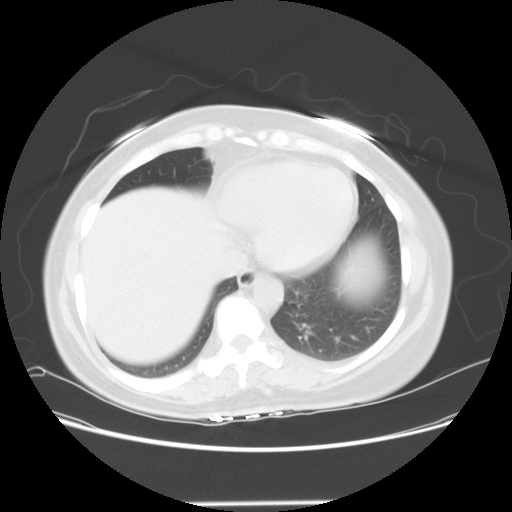

[Series 400: cor · coronal · 0.97mm/px · 7 of 112 slices shown]
[im 12/112  soft-tissue]
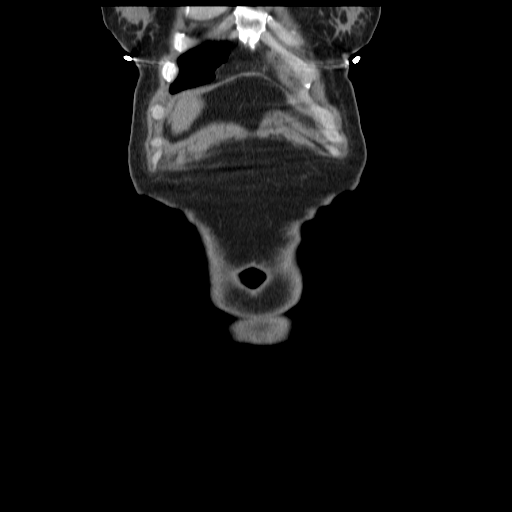
[im 23/112  soft-tissue]
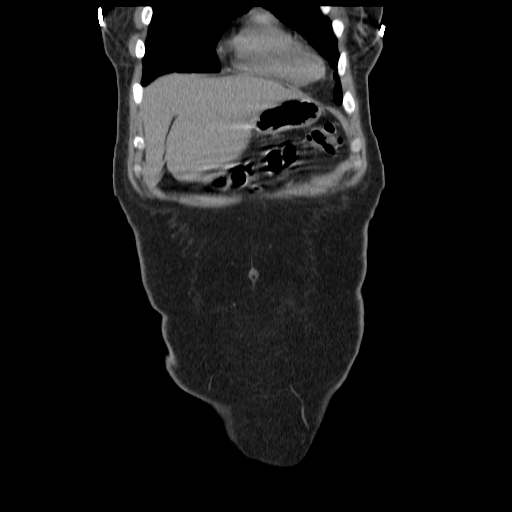
[im 34/112  soft-tissue]
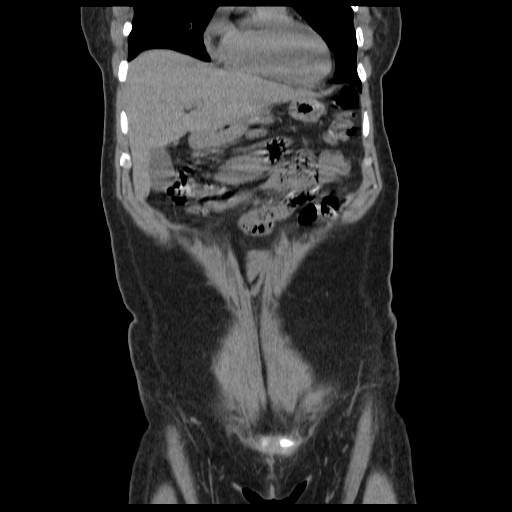
[im 45/112  soft-tissue]
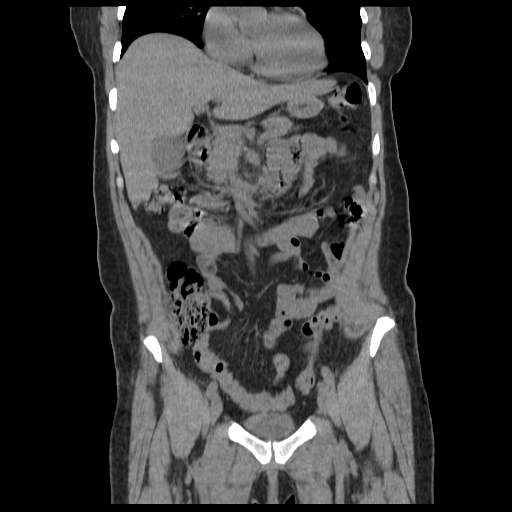
[im 67/112  soft-tissue]
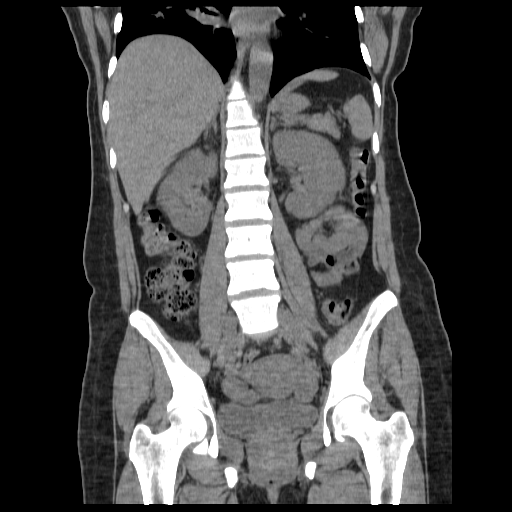
[im 78/112  soft-tissue]
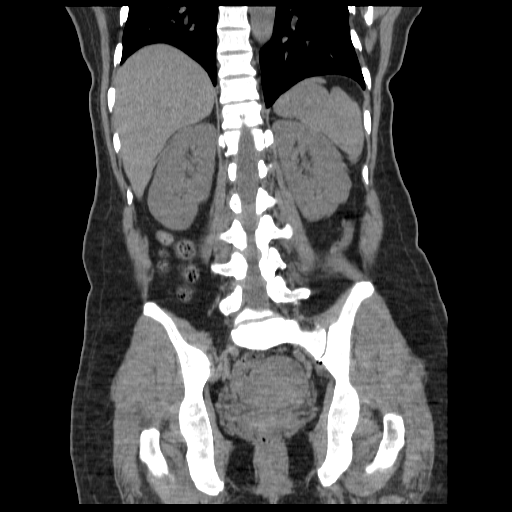
[im 89/112  soft-tissue]
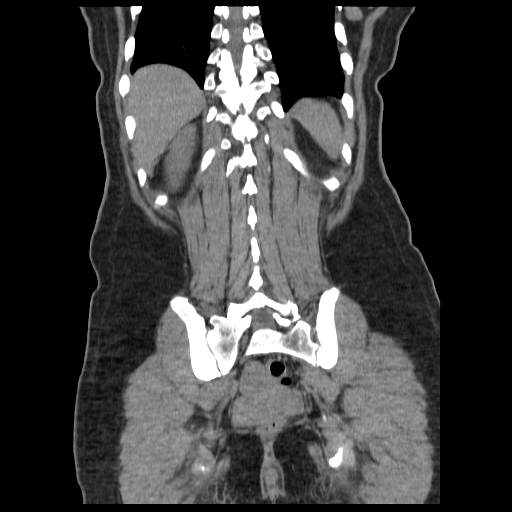

[14 of 32 positions shown; findings below may reference images not displayed]

FINDINGS: No focal abnormalities seen in the liver or spleen on
this study performed without intravenous contrast material.  The
stomach, duodenum, pancreas, gallbladder, adrenal glands, and
abdominal bowel loops have normal imaging features.

There is no evidence for stone neither kidney.  No ureteral or
bladder stones.  No secondary changes in either kidney.

No abdominal aortic aneurysm.  There is no free fluid or
lymphadenopathy in the abdomen.

Imaging through the pelvis shows no free intraperitoneal fluid.
There is no pelvic sidewall lymphadenopathy.  Uterus is
unremarkable.  There is no adnexal mass.  A few scattered
diverticuli are seen in the left colon without diverticulitis.  The
terminal ileum is normal.  The appendix is normal.

There is some minimal degenerative changes of the SI joints. No
worrisome lytic or sclerotic osseous lesions.
IMPRESSION: Unremarkable CT scan.  No findings to explain the patient's history
of left flank pain.

## 2012-11-11 ENCOUNTER — Ambulatory Visit (INDEPENDENT_AMBULATORY_CARE_PROVIDER_SITE_OTHER): Payer: Self-pay | Admitting: Obstetrics and Gynecology

## 2012-11-11 VITALS — BP 127/83 | HR 79 | Wt 153.0 lb

## 2012-11-11 DIAGNOSIS — N920 Excessive and frequent menstruation with regular cycle: Secondary | ICD-10-CM

## 2012-11-11 DIAGNOSIS — Z3009 Encounter for other general counseling and advice on contraception: Secondary | ICD-10-CM

## 2012-11-11 NOTE — Progress Notes (Signed)
CC: Postpartum Care and Menorrhagia     HPI Elizabeth Mora is a 42 y.o. [id number] at 4 months postpartum  who presents for birth control counseling. She does not want Depo-Provera or OCPs. After review of her options and discussion of benefits of LARC, she decided she would like to apply for the [facility] scholarship and get a Mirena IUD. She states she's breast-feeding and at times supplementing with bottle She had one menses the end of December which was heavy. No spotting. States she has not been sexually active because her partner is not around. She is also interested in mammogram. Denies family history of breast ovarian or colon cancer and is counseled that benefits and 26's age group are controversial:  she would like a baseline mammogram. Denies postpartum depression.  Past Medical History  Diagnosis Date  . Depression     h/x due to son's autism  . Pyelonephritis complicating pregnancy   . Frequent UTI     OB History   Grav Para Term Preterm Abortions TAB SAB Ect Mult Living   3 3 1 2  0 0 0 0 0 2     # Outc Date GA Lbr Len/2nd Wgt Sex Del Anes PTL Lv   1 PRE 8/06 [redacted]w[redacted]d 16:00 7lb7oz(3.374kg) M SVD EPI Yes Yes   2 PRE 2007 [redacted]w[redacted]d   F    SB   Comments: IUFD @ 5 mon had D&C   3 TRM 11/13 [redacted]w[redacted]d 00:00 8lb0.2oz(3.635kg) M LTCS Spinal  Yes      Past Surgical History  Procedure Laterality Date  . Dilation and curettage of uterus  2007  . Cesarean section  07/30/2012    Procedure: CESAREAN SECTION;  Surgeon: Allie Bossier, MD;  Location: [facility];  Service: Obstetrics;  Laterality: N/A;    History   Social History  . Marital Status: Single    Spouse Name: N/A    Number of Children: N/A  . Years of Education: N/A   Occupational History  . Not on file.   Social History Main Topics  . Smoking status: Never Smoker   . Smokeless tobacco: Never Used  . Alcohol Use: No  . Drug Use: No  . Sexually Active: Yes    Birth Control/ Protection: None   Other Topics Concern  . Not on file    Social History Narrative  . No narrative on file    No current outpatient prescriptions on file prior to visit.   No current facility-administered medications on file prior to visit.    Allergies  Allergen Reactions  . Tilapia (Fish Allergy) Anaphylaxis and Swelling    Pt states she only has allergy to Tilapia  . Aspirin Hives    Pt says she can take Ibuprofen     ROS Pertinent items in HPI  PHYSICAL EXAM Filed Vitals:   11/11/12 1438  BP: 104/66  Pulse: 73   General: Well nourished, well developed female in no acute distress Cardiovascular: Normal rate Respiratory: Normal effort Abdomen: Soft, nontender Back: No CVAT Extremities: No edema Neurologic: Alert and oriented Declines pelvic until next visit , here with 3 children  ASSESSMENT  1. Contraceptive education   Undesired fertility  PL[employer] form completed> return for Mirena IUD insertion when she has funds in about 1  Month. No intercourse for 2 wks prior to insertion appointment.   Mammogram scheduled.      Danae Orleans, CNM 11/11/2012 3:25 PM

## 2012-11-12 ENCOUNTER — Other Ambulatory Visit: Payer: Self-pay | Admitting: Obstetrics and Gynecology

## 2012-11-12 DIAGNOSIS — Z1231 Encounter for screening mammogram for malignant neoplasm of breast: Secondary | ICD-10-CM

## 2012-11-21 ENCOUNTER — Encounter: Payer: Self-pay | Admitting: Obstetrics and Gynecology

## 2012-11-25 ENCOUNTER — Ambulatory Visit (HOSPITAL_COMMUNITY): Admission: RE | Admit: 2012-11-25 | Payer: Self-pay | Source: Ambulatory Visit

## 2012-12-23 ENCOUNTER — Ambulatory Visit (HOSPITAL_COMMUNITY)
Admission: RE | Admit: 2012-12-23 | Discharge: 2012-12-23 | Disposition: A | Discharge status: Home / Self Care | Payer: Self-pay | Source: Ambulatory Visit | Attending: Obstetrics and Gynecology | Admitting: Obstetrics and Gynecology

## 2012-12-23 DIAGNOSIS — Z1231 Encounter for screening mammogram for malignant neoplasm of breast: Secondary | ICD-10-CM

## 2012-12-30 ENCOUNTER — Ambulatory Visit: Payer: Self-pay | Admitting: Obstetrics and Gynecology

## 2013-05-28 ENCOUNTER — Telehealth: Payer: Self-pay | Admitting: *Deleted

## 2013-05-28 NOTE — Telephone Encounter (Signed)
Received call from [facility]- interpreter stating that pt had called hospital and was forwarded to her. Pt c/o having a "spot" on her neck x3-4 days which is itching very badly. Pt is requesting appt for this c/o. I instructed Eda to tell pt that since this is not a Gyn problem she should go to Urgent Care to be evaluated.  She voiced understanding and stated she will give the information to the pt.

## 2014-07-13 ENCOUNTER — Encounter: Payer: Self-pay | Admitting: Obstetrics & Gynecology

## 2015-07-12 ENCOUNTER — Telehealth (HOSPITAL_COMMUNITY): Payer: Self-pay | Admitting: *Deleted

## 2015-07-12 NOTE — Telephone Encounter (Signed)
Telephoned patient at home # and left message to return call to [facility]CP. Used interpreter Julie Sowell. 

## 2015-07-14 ENCOUNTER — Other Ambulatory Visit: Payer: Self-pay | Admitting: Obstetrics and Gynecology

## 2015-07-14 DIAGNOSIS — Z1231 Encounter for screening mammogram for malignant neoplasm of breast: Secondary | ICD-10-CM

## 2015-07-29 ENCOUNTER — Ambulatory Visit
Admission: RE | Admit: 2015-07-29 | Discharge: 2015-07-29 | Disposition: A | Discharge status: Home / Self Care | Payer: No Typology Code available for payment source | Source: Ambulatory Visit | Attending: Obstetrics and Gynecology | Admitting: Obstetrics and Gynecology

## 2015-07-29 ENCOUNTER — Encounter (HOSPITAL_COMMUNITY): Payer: Self-pay

## 2015-07-29 ENCOUNTER — Ambulatory Visit (HOSPITAL_COMMUNITY)
Admission: RE | Admit: 2015-07-29 | Discharge: 2015-07-29 | Disposition: A | Discharge status: Home / Self Care | Payer: Self-pay | Source: Ambulatory Visit | Attending: Obstetrics and Gynecology | Admitting: Obstetrics and Gynecology

## 2015-07-29 VITALS — BP 130/94 | Temp 98.4°F | Ht 62.0 in | Wt 149.0 lb

## 2015-07-29 DIAGNOSIS — Z1231 Encounter for screening mammogram for malignant neoplasm of breast: Secondary | ICD-10-CM

## 2015-07-29 DIAGNOSIS — Z01419 Encounter for gynecological examination (general) (routine) without abnormal findings: Secondary | ICD-10-CM

## 2015-07-29 NOTE — Patient Instructions (Signed)
Educational materials on breast self awareness given. Explained to Elizabeth Mora that [facility] will cover Pap smears and HPV typing every 5 years unless has a history of abnormal Pap smears. [facility]. Appointment scheduled for Thursday, July 29, 2015 at 1540. Patient aware of appointment and will be there. Let patient know will follow up with her within the next couple weeks with results of Pap smear by phone. Let patient know the Breast Center will follow-up with her on results to mammogram by letter or phone. Carla Lynder ParentsGaldina Payson verbalized understanding.  Tressie Ragin, Kathaleen Maserhristine Poll, RN 2:20 PM

## 2015-07-29 NOTE — Progress Notes (Signed)
No complaints today.  Pap Smear:  Pap smear completed today. Patients last Pap [facility] and normal. Per patient has no history of an abnormal Pap smear. Last Pap smear result is in EPIC.  Physical exam: Breasts Breasts symmetrical. No skin abnormalities bilateral breasts. No nipple retraction bilateral breasts. No nipple discharge bilateral breasts. No lymphadenopathy. No lumps palpated bilateral breasts. No complaints of pain or tenderness on exam. [facility] mamm[facility]. Appointment scheduled for Thursday, July 29, 2015 at 1540.           Pelvic/Bimanual   Ext Genitalia No lesions, no swelling and no discharge observed on external genitalia.         Vagina Vagina pink and normal texture. No lesions or discharge observed in vagina.          Cervix Cervix is present. Cervix pink and of normal texture. Cervix friable. No discharge observed.     Uterus Uterus is present and palpable. Uterus in normal position and normal size.        Adnexae Bilateral ovaries present and palpable. No tenderness on palpation.          Rectovaginal No rectal exam completed today since patient had no rectal complaints. No skin abnormalities observed on exam.      Used interpreter Nira ConnJulia Sowell.

## 2015-08-03 ENCOUNTER — Telehealth (HOSPITAL_COMMUNITY): Payer: Self-pay | Admitting: *Deleted

## 2015-08-03 LAB — CYTOLOGY - PAP

## 2015-08-03 NOTE — Telephone Encounter (Signed)
Telephoned patient at home # and discussed negative pap smear results. HPV was negative. Next pap smear due in 5 years. Patient voiced understanding. Used interpreter Julie Sowell.  

## 2015-08-09 ENCOUNTER — Telehealth (HOSPITAL_COMMUNITY): Payer: Self-pay | Admitting: *Deleted

## 2015-08-09 NOTE — Telephone Encounter (Signed)
Telephoned patient at home # and discussed negative pap smear results. HPV was negative. Next pap smear due in 5 years. Used interpreter Julie Sowell.  

## 2015-08-27 ENCOUNTER — Ambulatory Visit: Payer: No Typology Code available for payment source

## 2015-09-10 ENCOUNTER — Other Ambulatory Visit: Payer: Self-pay

## 2015-09-10 ENCOUNTER — Ambulatory Visit: Payer: No Typology Code available for payment source

## 2015-09-10 ENCOUNTER — Ambulatory Visit (HOSPITAL_BASED_OUTPATIENT_CLINIC_OR_DEPARTMENT_OTHER): Payer: Self-pay

## 2015-09-10 VITALS — BP 140/100 | HR 80 | Temp 98.4°F | Resp 16 | Ht 64.0 in | Wt 150.0 lb

## 2015-09-10 DIAGNOSIS — Z0000 Encounter for general adult medical examination without abnormal findings: Secondary | ICD-10-CM

## 2015-09-10 LAB — GLUCOSE (CC13): GLUCOSE: 220 mg/dL — AB (ref 70–140)

## 2015-09-10 NOTE — Progress Notes (Signed)
Patient is a [facility] and is currently a [facility] patient effective 07/29/2015 with interpreter [name].   Clinical Measurements: Patient is 5 ft. 4 inches, weight 150 lbs,and BMI 25.8  Medical History: Patient has no history of high cholesterol or  hypertension. Patient does have a history of gestational diabetes and was told that after her pregnancy that it stayed up and was diabetic Patient stated that did not go back for treatment but has been watching what she eats. Per patient no diagnosed history of coronary heart disease, heart attack, heart failure, stroke/TIA, vascular disease or congenital heart defects.   Blood Pressure, Self-measurement: Patient states has had no reason to check Blood pressure. Discussed changeable and non changeable risk factors for hypertension.  Nutrition Assessment: Patient stated that does not eat 6 fruit every day. Per patient only eats on occasion. Patient states she eats 1 to 2 servings of vegetables a day. Per patient states does eat 3 or more ounces of whole grains daily. Patient stated doesn't eat two or more servings of fish weekly. Patient states she does not drink more than 36 ounces or 450 calories of beverages with added sugars weekly. Patient stated she does not watch her salt intake.   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  Smoking Status: Patient has never smoked and is not exposed to smoke.   Quality of Life Assessment: In assessing patient's quality of life she stated that out of the past 30 days that she has felt her health is good all of them. Patient also stated that in the past 30 days that her mental health was good including stress, depression and problems with emotions for all but 2 days. Patient did state that out of the past 30 days she felt her physical or mental health had not kept her  from doing her usual activities including self-care, work or recreation.   Plan: Lab work will be done today including a lipid panel, blood glucose, and Hgb A1C. Will call lab results when they are finished. Will discuss risk reduction counseling when call results.

## 2015-09-10 NOTE — Patient Instructions (Signed)
Discussed health assessment with patient. Talked with patient about hypertensive risk factors.  Let patient know that will call her next week with lab results and make appointment at at doctors for blood pressure. Informed not to let doctor do any labs or procedures until goes to eligibility and get orange card or patient assistance. Will decrease salt intake Patient verbalized understanding.

## 2015-09-11 LAB — HEMOGLOBIN A1C
HEMOGLOBIN A1C: 10.5 % — AB (ref <5.7)
Mean Plasma Glucose: 255 mg/dL — ABNORMAL HIGH (ref <117)

## 2015-09-11 LAB — LIPID PANEL
CHOL/HDL RATIO: 3.1 ratio (ref <=5.0)
CHOLESTEROL: 181 mg/dL (ref 125–200)
HDL: 58 mg/dL (ref >=46)
LDL Cholesterol: 109 mg/dL (ref <130)
TRIGLYCERIDES: 71 mg/dL (ref <150)
VLDL: 14 mg/dL (ref <30)

## 2015-09-14 ENCOUNTER — Telehealth: Payer: Self-pay

## 2015-09-14 NOTE — Telephone Encounter (Signed)
CCalled to inform about lab work from 09/10/15. Interpreter, [name] informed patient: cholesterol- 181, HDL- 58, LDL- 109, triglycerides - 97, Bld Glucose -220 and HBG-A1C - 10.5. Did risk reduction counseling concerning carbohydrates and sugars. Patient stated that was probably [facility]. [facility] on Wednesday, January 11 th at 10:45 AM. Told patient that appointment was just there related to blood sugar. Do not allow to draw more blood or do injections because [facility] does not cover. Patient will need to go through eligibility first.  PLAN: Will call about further follow up [facility](Health Coaching) after doctor's appointment.

## 2015-09-22 ENCOUNTER — Ambulatory Visit: Payer: Self-pay | Admitting: Obstetrics and Gynecology

## 2015-09-27 ENCOUNTER — Encounter: Payer: Self-pay | Admitting: Internal Medicine

## 2015-09-27 ENCOUNTER — Ambulatory Visit (INDEPENDENT_AMBULATORY_CARE_PROVIDER_SITE_OTHER): Payer: Self-pay | Admitting: Internal Medicine

## 2015-09-27 VITALS — BP 160/84 | HR 104 | Temp 98.3°F | Ht 64.0 in | Wt 146.0 lb

## 2015-09-27 DIAGNOSIS — E119 Type 2 diabetes mellitus without complications: Secondary | ICD-10-CM

## 2015-09-27 DIAGNOSIS — I10 Essential (primary) hypertension: Secondary | ICD-10-CM

## 2015-09-27 DIAGNOSIS — E1110 Type 2 diabetes mellitus with ketoacidosis without coma: Secondary | ICD-10-CM

## 2015-09-27 DIAGNOSIS — E1310 Other specified diabetes mellitus with ketoacidosis without coma: Secondary | ICD-10-CM

## 2015-09-27 LAB — COMPLETE METABOLIC PANEL WITH GFR
ALBUMIN: 4.2 g/dL (ref 3.6–5.1)
ALK PHOS: 62 U/L (ref 33–115)
ALT: 16 U/L (ref 6–29)
AST: 12 U/L (ref 10–35)
BUN: 12 mg/dL (ref 7–25)
CALCIUM: 9.2 mg/dL (ref 8.6–10.2)
CO2: 27 mmol/L (ref 20–31)
CREATININE: 0.57 mg/dL (ref 0.50–1.10)
Chloride: 100 mmol/L (ref 98–110)
GFR, Est African American: >89 mL/min (ref >=60)
GFR, Est Non African American: >89 mL/min (ref >=60)
Glucose, Bld: 221 mg/dL — ABNORMAL HIGH (ref 65–99)
Potassium: 4.4 mmol/L (ref 3.5–5.3)
Sodium: 136 mmol/L (ref 135–146)
TOTAL PROTEIN: 7.5 g/dL (ref 6.1–8.1)
Total Bilirubin: 0.6 mg/dL (ref 0.2–1.2)

## 2015-09-27 MED ORDER — METFORMIN HCL 500 MG PO TABS: ORAL_TABLET | ORAL | Status: DC

## 2015-09-27 NOTE — Assessment & Plan Note (Signed)
-   Prescribed metformin 500 mg with instructions for patient to increase dose slowly, starting with 500 mg with breakfast for 1 week, then increasing to with breakfast and dinner up to 1000 mg BID as tolerated. - Ordered CMP, given initiation of medical treatment of diabetes and possible need to start treatment for HTN at follow-up. - Patient counseled to increase vegetable intake. As for exercise, she said she would consider brisk walking on the weekends as a place to start.

## 2015-09-27 NOTE — Assessment & Plan Note (Signed)
-   Patient with elevated BPs at medical appointments on more than 2 occassions. - Patient states she will use a friend's blood pressure cuff or buy her own to get more measurements prior to follow-up.  - Advised patient if blood pressure remains elevated at follow-up, medical management will more than likely need to be initiated.  - Check CMP today.

## 2015-09-27 NOTE — Progress Notes (Signed)
Subjective:    Patient ID: Elizabeth Mora, female    DOB: November 27, 1970, 45 y.o.   MRN: [id number]  Visit conducted with video interpreter Helmut MusterAlicia ([id number]) and Araceli [phone]).   HPI Elizabeth Mora is a 45 y.o. female [facility] for elevated blood sugars and blood pressure.   Diabetes: - Hgb A1c of 10.5 on 09/10/2015 - Patient states she has been told she has high blood sugars ever since her now 13-y.o. Daughter was 134 months old. Her last baby was born in 2013. - Says she has never been prescribed medication for diabetes and has never had any symptoms. Does not know anyone who has had bad outcomes from diabetes but says she knows patients can need amputations.  - She takes a "natural" product from a brand called Omnilife to reduce her blood sugars. Ingredients of a powder from this brand, which patient had in her purse, include almond powder, soy, inulin, phytosterols, xantham gum, sucralose, lecithin, tocopherol, stevia leaf. However, she says she normally takes 3 pills from this brand before breakfast, lunch and dinner. She could not name a particular ingredient that she felt would provide benefit but explained a friend who has diabetes has used this and had sugars controlled and that the she believed the product cured her daughter from asthma. - Diet includes a lot of protein and whole grains. Yesterday's meals were breakfast of oatmeal with nuts, lunch of tuna sandwich on whole wheat bread, dinner of chicken and tortillas with snacks of a grapefruit and kiwi and water to drink. She stopped drinking soda years ago because it is high in sugar. 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   HTN: - States she had never been told she had high blood pressure before visit for PAP smear. Notes that her BP was fine 6-8 months ago at a [facility]  health screening event.  - Denies any family history of HTN, heart disease.  - Does not regularly check her BP, does not own a cuff.  - Feels that she gets stressed out easily.  - Lipid panel showed TChol 181, HDL 58 and LDL of 109. (10 year ASCVD risk of 2.9%)  Health Maintenance: - Pap smear normal 07/29/15.  PMH: Denies taking any prescribed medications. Denies having any medical conditions previously. Documented history of post-partum depression. Denies any surgeries.  FMH: Unaware of any familial diseases, including diabetes, HTN and cancers.   Social: Has 3 daughters aged 723, 246 and 6313. Works at [employer] ([employer])   Review of Systems  Eyes:       Does not occasional blurry vision when reading small text.   Gastrointestinal: Negative for abdominal pain and constipation.  Endocrine: Negative for polyuria.      Objective: Blood pressure 160/84, pulse 104, temperature 98.3 F (36.8 C), temperature source Oral, height 5\' 4"  (1.626 m), weight 146 lb (66.225 kg), SpO2 98 %.   Physical Exam  Constitutional: She is oriented to person, place, and time. She appears well-developed and well-nourished.  HENT:  Head: Normocephalic and atraumatic.  Mouth/Throat: Oropharynx is clear and moist.  Cardiovascular: Normal rate and regular rhythm.  Exam reveals no gallop and no friction rub.   No murmur heard. Pulmonary/Chest: Effort normal and breath sounds normal. No respiratory distress. She has no wheezes.  Abdominal: Soft. Bowel  sounds are normal. There is no tenderness. There is no rebound and no guarding.  Neurological: She is alert and oriented to person, place, and time.  Skin: Skin is warm and dry.  Psychiatric: She has a normal mood and affect. Her behavior is normal. Judgment and thought content normal.      Assessment & Plan:  Elizabeth Mora is a 44-y.o. woman with T2DM and likely HTN, though white coat hypertension may be at play. Will initiate treatment of diabetes with  metformin.  Follow-up in 2 weeks to see how patient is tolerating metformin and for BP recheck.   Diabetes (HCC) - Prescribed metformin 500 mg with instructions for patient to increase dose slowly, starting with 500 mg with breakfast for 1 week, then increasing to with breakfast and dinner up to 1000 mg BID as tolerated. - Ordered CMP, given initiation of medical treatment of diabetes and possible need to start treatment for HTN at follow-up. - Patient counseled to increase vegetable intake. As for exercise, she said she would consider brisk walking on the weekends as a place to start.   HTN (hypertension) - Patient with elevated BPs at medical appointments on more than 2 occassions. - Patient states she will use a friend's blood pressure cuff or buy her own to get more measurements prior to follow-up.  - Advised patient if blood pressure remains elevated at follow-up, medical management will more than likely need to be initiated.  - Check CMP today.    Dani Gobble, MD [facility], PGY-1

## 2015-09-27 NOTE — Patient Instructions (Signed)
Please return in 2 weeks to follow-up blood pressure and diabetes.  Alogliptin; Metformin oral tablets Qu es este medicamento? Gara Kroner; METFORMINA es una combinacin de 2 medicamentos que se [place] para tratar la diabetes tipo 2. Este medicamento disminuye el nivel de Banker. El tratamiento se [place] con ejercicios y [place]. Este medicamento puede ser utilizado para otros usos; si tiene alguna pregunta consulte con su proveedor de atencin mdica o con su farmacutico. Qu le debo informar a mi profesional de la salud antes de tomar este medicamento? Necesitan saber si usted presenta alguno de los [employer] o situaciones: anemia se deshidrata fcilmente cetoacidosis diabtica enfermedad cardiaca insuficiencia cardiaca antecedentes de problemas de abuso de alcohol si bebe alcohol con frecuencia enfermedad renal enfermedad heptica niveles bajos de vitamina B12 en la sangre mayor de 80 aos pancreatitis sndrome de ovario poliqustico inflamacin previa de Scientist, product/process development, el rostro o los labios con dificultad para Industrial/product designer, dificultad para tragar, ronquera o estrechamiento de la garganta infeccin o lesin grave enfermedad tiroidea diabetes tipo 1 si le realizarn [place] o ciertos procedimientos con agentes de contraste inyectables una reaccin alrgica o inusual a la alogliptina, la metformina, a otros medicamentos, alimentos, colorantes o conservantes si est embarazada o buscando quedar embarazada si est amamantando a un beb Cmo debo utilizar este medicamento? [place] este medicamento por va oral con un vaso de agua. Tome este medicamento con alimentos. No corte este medicamento. Siga las instrucciones de la etiqueta del [place]. Tome sus dosis a intervalos regulares. No lo tome con una frecuencia mayor a la indicada. No deje de tomarlo excepto si as lo indica su mdico. Hable con su pediatra para informarse acerca del uso de este medicamento en nios. Puede  requerir atencin especial. Sobredosis: Pngase en contacto inmediatamente con un centro toxicolgico o una sala de urgencia si usted cree que haya tomado demasiado medicamento. ATENCIN: [employer] es solo para usted. No comparta este medicamento con nadie. Qu sucede si me olvido de una dosis? Si se olvida una dosis, tmela lo antes posible. Si es casi la hora de la prxima dosis, tome slo esa dosis. No tome dosis adicionales o dobles. Qu puede interactuar con este medicamento? No tome esta medicina con ninguno de los siguientes medicamentos: ciertos agentes de contraste administrados antes de un procedimiento con rayos X, tomografas computadas (CT), MRI u otros procedimientos dofetilida gatifloxacino Esta medicina tambin puede interactuar con los siguientes medicamentos: acetazolamida alcohol amilorida ciertos medicamentos antivirales para la infeccin por VIH o hepatitis cimetidina crizotinib digoxina diurticos hormonas femeninas, como estrgenos, progestinas o pldoras anticonceptivas glucopirrolato isoniazida lamotrigina medicamentos para presin sangunea, enfermedad cardiaca, pulso cardiaco irregular memantina metazolamida midodrina morfina cido nicotnico fenotiazinas, tales como clorpromacina, mesoridazina, proclorperazina, tioridazina fenitona procainamida propantelina quinidina quinina ranitidina ranolazina medicamentos esteroideos, como prednisona o cortisona medicamentos estimulantes para trastornos de Visual merchandiser, perder peso o mantenerse despierto medicamentos tiroideos topiramato trimetoprima trospio vancomicina vandetanib zonisamida Puede ser que esta lista no menciona todas las posibles interacciones. Informe a su profesional de [employer] de [employer] productos a base de hierbas, medicamentos de [place] o suplementos nutritivos que est tomando. Si usted fuma, consume bebidas alcohlicas o si utiliza drogas ilegales, indqueselo tambin a su profesional de [employer].  Algunas sustancias pueden interactuar con su medicamento. A qu debo estar atento al usar [employer]? Visite a su mdico o a su profesional de la salud para chequear su evolucin peridicamente. Un examen llamado HbA1C (A1C) ser monitoreado. Es un  simple examen de sangre. Mide su control de azcar en la sangre durante los ltimos 2 a 3 meses. Usted recibir [employer] cada 3 a 6 meses. Aprenda cmo controlar el nivel de azcar en la sangre. Aprenda a reconocer los sntomas de bajo y alto nivel de azcar en la sangre y cmo tratarlos. Siempre lleve consigo una fuente rpida de azcar por si acaso experimenta sntomas de bajo nivel de azcar en la sangre. Ejemplos incluyen caramelos duros o tabletas de glucosa. Asegrese de que los miembros de su familia sepan que se puede ahogar si come o bebe mientras tiene sntomas graves de bajo nivel de azcar en la sangre, tales como convulsiones o prdida del conocimiento. Deben obtener ayuda mdica inmediatamente. Informe a su mdico o a su profesional de la salud si tiene alto nivel de Dispensing optician. Tal vez sea necesario cambiar la dosis de su medicamento. Si est enfermo o haciendo mucho ms ejercicio que el habitual, puede ser necesario cambiar la dosis de su medicamento. No se salte comidas. Pregunte a su mdico o a su profesional de la salud si debe evitar el consumo de alcohol. Muchos productos de venta libre para tos y resfros contienen azcar y alcohol. Estos pueden Magazine features editor de azcar en la sangre. Este medicamento puede provocar la ovulacin en mujeres premenopusicas que no tienen periodos menstruales regulares. Esto puede aumentar la posibilidad de [place]. No debe tomar este medicamento si se queda embarazada o si cree que est embarazada. Consulte a su mdico o su profesional de la salud sobre sus opciones anticonceptivas mientras est tomando [employer]. Si cree que est embarazada, consulte a su mdico o su  profesional de la salud inmediatamente. Si va a someterse a una operacin, IRM (MRI), tomografa computarizada u otro procedimiento, informe a su mdico que est tomando [employer]. Usted podr necesitar dejar de tomar este medicamento antes del procedimiento. Use una pulsera o cadena de identificacin mdica. Lleve consigo una tarjeta de identificacin con informacin sobre su enfermedad y Scientist, research (medical) de sus medicamentos y los horarios de las dosis. Qu efectos secundarios puedo tener al [employer] este medicamento? Efectos secundarios que debe informar a su mdico o a Barrister's clerk de la salud tan pronto como sea posible: Chief of Staff como erupcin cutnea, picazn o urticarias, hinchazn de la cara, labios o lengua problemas respiratorios orina de color oscuro sensacin general de estar enfermo o sntomas gripales dolor articular heces claras prdida de apetito dolor muscular nuseas, vmito dolor en la regin abdominal superior derecha signos y sntomas de bajo nivel de azcar en la sangre tales como sentirse ansioso, confusin, mareos, aumento de apetito, debilidad o cansancio inusual, sudoracin, temblores, fro, irritabilidad, dolor de cabeza, visin borrosa, pulso cardaco rpido, prdida del conocimiento pulso cardiaco lento o irregular dolor o malestar estomacal inusual color amarillento de los ojos o la piel Efectos secundarios que, por lo general, no requieren atencin mdica (debe informarlos a su mdico o a Barrister's clerk de la salud si persisten o si son molestos): diarrea dolor de cabeza Geographical information systems officer estomacal sabor metlico en la boca gas, malestar estomacal nariz tapada o goteo de la nariz Puede ser que esta lista no menciona todos los posibles efectos secundarios. Comunquese a su mdico por asesoramiento mdico [employer]. Usted puede informar los efectos secundarios a la FDA por telfono al 1-800-FDA-1088. Dnde debo guardar mi medicina? Mantngala fuera del  alcance de los nios. Gurdela a [employer], entre 20 y 25 grados C (  30 y 25 grados F). Deseche todo el medicamento que no haya utilizado, despus de la fecha de vencimiento. ATENCIN: Este folleto es un resumen. Puede ser que no cubra toda la posible informacin. Si usted tiene preguntas acerca de esta medicina, consulte con su mdico, su farmacutico o su profesional de Technical sales engineer.    2016, Elsevier/Gold Standard. (2015-01-28 00:00:00)

## 2015-10-01 ENCOUNTER — Telehealth: Payer: Self-pay

## 2015-10-01 NOTE — Telephone Encounter (Signed)
Called to follow up with Health Coaching or another program concerning her diabetes and blood pressure. Will call again.

## 2015-10-01 NOTE — Progress Notes (Signed)
[facility] PATIENT NAVIGATION: Patient was referred to [facility] concerning lab results of elevated Hgb A1C (10.5) and blood pressure. Patient was seen by doctor on 09/26/1970 concerning her present A1C and history of gestational Diabetes which had not resolved.  NEEDS ASSESSMENT: Patient did have barriers, help understanding medical follow up needs from past, not being aware of access to services and the needed support.  PLAN of CARE; Patient now has access to services but will need to be followed up on follow through of doing Eligibility. Will need to make sure what services are available to assist with doctor payment, medications and speciality care.   Patient will be approached for Health Coaching or other services to help with problem areas. Will discuss possible Heart Wise Program for blood pressure. Will explore possible other barriers patient may perceive.  Will need to see about follow equipment patient may need for blood sugar and blood pressure.  PLAN: Call patient for health coaching or other programs. Follow up per phone.

## 2015-10-06 ENCOUNTER — Ambulatory Visit: Payer: Self-pay

## 2015-10-06 VITALS — BP 156/93

## 2015-10-06 DIAGNOSIS — Z789 Other specified health status: Secondary | ICD-10-CM

## 2015-10-06 NOTE — Progress Notes (Signed)
Patient returns today for Health Coaching regarding Hypertension with the [facility] of [facility] with interpreter [employer].  FOLLOW UP ASSESSMENT: Reviewed patients labs and blood pressure. Reinforced what doctor told in office. Patient stated that was taking Metformin for diabetes. Per patient has not experienced any diarrhea. Patient stated that was not given a glucose meter to check blood sugar. Discussed with Lida that was not taking anything for blood pressure right now and should be doing risk reduction methods for high blood pressure. Reviewed with patient on handout the risk modifications that needed to do.  DIABETES: Briefly went over some handouts with patient discussing things to avoid, food group portions, and serving sizes. Received a measuring cup to see serving sizes and for self use.  MONITORING BLOOD PRESSURE: Instructed patient about blood pressure machine and how to take blood pressure by using handouts, verbal and demonstration.Patient returned demonstrated properly how to take her blood pressure. Went over [employer] self monitoring agreement and patient signed. Demonstrated and explained how to fill out blood pressure log. Dates for blood pressure checking were placed on daily and weekly sheets and gone over. Follow up dates were given to patient. Patient's blood pressure when she took it was 156/93. Received return envelope for logs.  HYPERTENSION: Went over medications plus explained that if blood pressure was low and she was feeling weak and tired to call doctor.  Interpreter went over hypertension handouts with patient.Confirmed patient contact information.  PLAN: Will call on 10/20/15 for first two logs and then on 11/16/15 . Two more health coachings and follow up assessment.

## 2015-10-06 NOTE — Patient Instructions (Signed)
Patient will measure food portions. Will not eat more than 7 to 8 starches and 3 fruits per day. Will not eat or drink items that contain sugar. Will take BP daily times 2 weeks and record. Will mail numbers to [place]. Will take BP once a week for 8 weeks and then mail in to [place].

## 2015-10-11 ENCOUNTER — Ambulatory Visit (INDEPENDENT_AMBULATORY_CARE_PROVIDER_SITE_OTHER): Payer: Self-pay | Admitting: Internal Medicine

## 2015-10-11 ENCOUNTER — Encounter: Payer: Self-pay | Admitting: Internal Medicine

## 2015-10-11 VITALS — BP 157/94 | HR 91 | Temp 98.1°F | Wt 144.2 lb

## 2015-10-11 DIAGNOSIS — Z3009 Encounter for other general counseling and advice on contraception: Secondary | ICD-10-CM

## 2015-10-11 DIAGNOSIS — E119 Type 2 diabetes mellitus without complications: Secondary | ICD-10-CM

## 2015-10-11 DIAGNOSIS — IMO0001 Reserved for inherently not codable concepts without codable children: Secondary | ICD-10-CM | POA: Insufficient documentation

## 2015-10-11 DIAGNOSIS — I10 Essential (primary) hypertension: Secondary | ICD-10-CM

## 2015-10-11 MED ORDER — METFORMIN HCL 1000 MG PO TABS
1000.0000 mg | ORAL_TABLET | Freq: Two times a day (BID) | ORAL | Status: AC
Start: 2015-10-11 — End: ?

## 2015-10-11 MED ORDER — LISINOPRIL 10 MG PO TABS: 10.0000 mg | ORAL_TABLET | Freq: Every day | ORAL | Status: DC

## 2015-10-11 MED ORDER — HYDROCHLOROTHIAZIDE 25 MG PO TABS: 25.0000 mg | ORAL_TABLET | Freq: Every day | ORAL | Status: AC

## 2015-10-11 NOTE — Assessment & Plan Note (Signed)
-   Counseled patient on importance of birth control prevention. - Provided handouts about Nexplanon and Mirena.

## 2015-10-11 NOTE — Progress Notes (Signed)
Subjective:     Patient ID: Elizabeth Mora, female   DOB: 1971-03-19, 45 y.o.   MRN: [id number]  Visit conducted with help of video interpreters Rebeca ([id number]) and Penni Bombard [phone]).  HPI T2DM: - Patient was started on metformin earlier this month. - She denies any side effects from the medication, including nausea and diarrhea. - She has been taking two 500 mg pills at breakfast but has not started taking any pills at dinner.  - Hgb A1c was 10.5 09/10/15 - SCr was 0.57 and GFR was >89 on CMET 09/27/15  HTN: - Patient has had elevated blood pressures at her last several doctor's appointments. - She says she has a blood pressure cuff at home and the last couple systolic measurements were in the 150s.  - She is being followed by the [facility] and has been advised to keep a blood pressure log.  - Patient starting drinking a mixture of parsley, garlic and lemon to try to improve her blood pressure naturally.  - Discussed diet and exercise extensively at last appointment. 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   Birth control: - Patient has a regular period. She is not using any form of birth control and is sexually active. Patient reports she loves her 3 daughters but does not want more children. - She received depo initially after giving birth to her now three-year-old daughter but did not like how she felt after receiving it and has not been on birth control since. - She has thought about getting Nexplanon.   Review of Systems  Eyes: Negative for visual disturbance.  Cardiovascular: Negative for chest pain.  Gastrointestinal: Negative for abdominal pain.  Neurological: Negative for headaches.   Social: Never Smoker    Objective:   Physical Exam  Constitutional: She is oriented to person, place, and time. She appears well-developed and well-nourished. No distress.   Cardiovascular: Normal rate, regular rhythm and normal heart sounds.  Exam reveals no gallop and no friction rub.   No murmur heard. Musculoskeletal: She exhibits no edema.  Neurological: She is alert and oriented to person, place, and time.      Assessment:     Elizabeth Mora is a 44-y.o. female with T2DM and HTN tolerating therapy with metformin well and with continued elevated blood pressure readings in clinic and at home.     Plan:     Return in 2 weeks for blood pressure check and discuss birth control options.     Diabetes (HCC) - Advised patient to start taking metformin with dinner, as well, starting with one 500 mg pill at night for a week and increasing to 2 pills the next week. - Prescribed metformin 1000 mg for patient to continue taking after finishing bottle of 500 mg.   HTN (hypertension) - Prescribed HCTZ 25 mg daily. - Asked patient to continue logging blood pressures, preferably twice daily until follow-up to see effect of medication.  Birth control - Counseled patient on importance of birth control prevention. - Provided handouts about Nexplanon and Mirena.    Dani Gobble, MD [facility], PGY-1

## 2015-10-11 NOTE — Progress Notes (Signed)
[facility] PATIENT NAVIGATION FOLLOW UP  ACCESS TO SERVICES: Patient attended health Coaching session on HEART WISE Program where she learned to take blood pressure and was given access to a blood pressure monitor. Attended return blood pressure check at [facility] and doctor placed patient on Hydrodiuril (HCTZ) 25 mg per day. Patient was scheduled for an Eligibility appointment on February 13. Patient received all information in Spanish of what was required for Eligibility appointment.  MEDICATIONS: Will need to follow up to see if any barriers for support and knowledge of medications. Will need to follow up on knowledge of hyper and hypoglycemia.  PLAN: Call for HEART WISE program follow up on 2/13. Check on knowledge of medication per interpreter. Check for any other needs, barriers, lack of access services or knowledge deficits.

## 2015-10-11 NOTE — Assessment & Plan Note (Signed)
-   Prescribed HCTZ 25 mg daily. - Asked patient to continue logging blood pressures, preferably twice daily until follow-up to see effect of medication.

## 2015-10-11 NOTE — Patient Instructions (Signed)
Elizabeth Mora,  Gracias por venir hoy.  Por favor contine tomando metformina 1000 mg dos veces al da con las comidas.  Para la presin arterial, por favor tome 25 mg (1 pldora) de hidroclorotiazida una vez al da. Si usted Magazine features editor su presin arterial dos veces al da cuando comienza este nuevo medicamento en casa y Pharmacologist un registro, que sera muy til.  Vuelva por favor en 2 semanas para una verificacin de la presin arterial y para discutir opciones del control de la natalidad.  Mejor, Dr. Sampson Goon  Levonorgestrel intrauterine device (IUD) Qu es este medicamento? El LEVONORGESTREL (DIU) es un dispositivo anticonceptivo (control de natalidad). El dispositivo se coloca dentro del tero por un profesional de la salud. Se utiliza para Location manager y tambin se puede Chemical engineer para tratar el sangrado abundante que ocurre durante su perodo. Dependiendo del dispositivo, se puede utilizar por 3 a 5 aos. Este medicamento puede ser utilizado para otros usos; si tiene alguna pregunta consulte con su proveedor de atencin mdica o con su farmacutico. Qu le debo informar a mi profesional de la salud antes de tomar este medicamento? Necesita saber si usted presenta alguno de los siguientes problemas o situaciones: -exmen de Papanicolaou anormal -cncer de mama, cuello del tero o tero -diabetes -endometritis -si tiene una infeccin plvica o genital actual o en el pasado -tiene ms de una pareja sexual o si su pareja tiene ms de una pareja -enfermedad cardiaca -antecedente de embarazo tubrico o ectpico -problemas del sistema inmunolgico -DIU colocado -enfermedad heptica o tumor del hgado -problemas con la coagulacin o si toma diluyentes sanguneos -[place] medicamentos intravenoso -forma inusual del tero -sangrado vaginal que no tiene explicacin -una reaccin alrgica o inusual al levonorgestrel, a otras hormonas, a la silicona o polietilenos, a otros  medicamentos, alimentos, colorantes o conservantes -si est embarazada o buscando quedar embarazada -si est amamantando a un beb Cmo debo utilizar este medicamento? Un profesional de Naval architect este dispositivo en el tero. Hable con su pediatra para informarse acerca del uso de este medicamento en nios. Puede requerir atencin especial. Sobredosis: Pngase en contacto inmediatamente con un centro toxicolgico o una sala de urgencia si usted cree que haya tomado demasiado medicamento. ATENCIN: [employer] es solo para usted. No comparta este medicamento con nadie. Qu sucede si me olvido de una dosis? No se aplica en este caso. Qu puede interactuar con este medicamento? No tome esta medicina con ninguno de los siguientes medicamentos: -amprenavir -bosentano -fosamprenavir Esta medicina tambin puede interactuar con los siguientes medicamentos: -aprepitant -barbitricos para producir el sueo o para el tratamiento de convulsiones -bexaroteno -griseofulvina -medicamentos para tratar los convulsiones, tales como [place], [place], [place], [place], [place], topiramato -modafinilo -pioglitazona -rifabutina -rifampicina -rifapentina -algunos medicamentos para tratar el virus VIH, tales como atazanavir, indinavir, lopinavir, nelfinavir, tipranavir, ritonavir -hierba de [place] -warfarina Puede ser que esta lista no menciona todas las posibles interacciones. Informe a su profesional de [employer] de [employer] productos a base de hierbas, medicamentos de [place] o suplementos nutritivos que est tomando. Si usted fuma, consume bebidas alcohlicas o si utiliza drogas ilegales, indqueselo tambin a su profesional de [employer]. Algunas sustancias pueden interactuar con su medicamento. A qu debo estar atento al usar [employer]? Visite a su mdico o a su profesional de la salud para chequear su evolucin peridicamente. Visite a su mdico si usted o su  pareja tiene relaciones sexuales con otras personas, se vuelve VIH positivo o contrae una enfermedad  de transmisin sexual. Este medicamento no la protege de la infeccin por VIH (SIDA) ni de ninguna otra enfermedad de transmisin sexual. Puede controlar la ubicacin del DIU usted misma palpando con sus dedos limpios los hilos en la parte anterior de la vagina. No tire de los hilos. Es un buen hbito controlar la ubicacin del dispositivo despus de cada perodo menstrual. Si no slo siente los hilos sino que adems siente otra parte ms del DIU o si no puede sentir los hilos, consulte a su mdico inmediatamente. El DIU puede salirse por s solo. Puede quedar embarazada si el dispositivo se sale de Nature conservation officer. Utilice un mtodo anticonceptivo adicional, como preservativos, y consulte a su proveedor de atencin mdica s observa que el DIU se sali de Nature conservation officer. La utilizacin de tampones no cambia la posicin del DIU y no hay inconvenientes en usarlos durante su perodo. Qu efectos secundarios puedo tener al [employer] este medicamento? Efectos secundarios que debe informar a su mdico o a Producer, television/film/video de la salud tan pronto como sea posible: -Therapist, art como erupcin cutnea, picazn o urticarias, hinchazn de la cara, labios o lengua -fiebre, sntomas gripales -llagas genitales -alta presin sangunea -ausencia de un perodo menstrual durante 6 semanas mientras lo utiliza -Engineer, mining, Public librarian en las piernas -dolor o sensibilidad del plvico -dolor de cabeza repentino o severo -signos de Psychiatrist -calambres estomacales -falta de aliento repentina -problemas de coordinacin, del habla, al caminar -sangrado, flujo vaginal inusual -color amarillento de los ojos o la piel Efectos secundarios que, por lo general, no requieren atencin mdica (debe informarlos a su mdico o a su profesional de la salud si persisten o si son molestos): -acn -dolor de pecho -cambios en el deseo sexual  o capacidad -cambios de peso -calambres, Research scientist (life sciences) o sensacin de [employer] se introduce el dispositivo -dolor de cabeza -sangrado menstruales irregulares en los primeros 3 a 6 meses de usar -nuseas Puede ser que esta lista no menciona todos los posibles efectos secundarios. Comunquese a su mdico por asesoramiento mdico [employer]. Usted puede informar los efectos secundarios a la FDA por telfono al 1-800-FDA-1088. Dnde debo guardar mi medicina? No se aplica en este caso. ATENCIN: Este folleto es un resumen. Puede ser que no cubra toda la posible informacin. Si usted tiene preguntas acerca de esta medicina, consulte con su mdico, su farmacutico o su profesional de Radiographer, therapeutic.    2016, Elsevier/Gold Standard. (2014-10-20 00:00:00)  Etonogestrel implant Qu es este medicamento? El ETONOGESTREL es un dispositivo anticonceptivo (control de la natalidad). Se utiliza para Neurosurgeon. Se puede utilizar hasta 3 aos. Este medicamento puede ser utilizado para otros usos; si tiene alguna pregunta consulte con su proveedor de atencin mdica o con su farmacutico. Qu le debo informar a mi profesional de la salud antes de tomar este medicamento? Necesita saber si usted presenta alguno de los siguientes problemas o situaciones: sangrado vaginal anormal enfermedad vascular o cogulos sanguneos cncer de mama, cervical, heptico depresin diabetes enfermedad de la vescula biliar dolores de cabeza enfermedad cardiaca o ataque cardiaco reciente alta presin sangunea alto nivel de colesterol enfermedad renal enfermedad heptica convulsiones fuma tabaco una reaccin alrgica o inusual al etonogestrel, otras hormonas, anestsicos o antispticos, medicamentos, alimentos, colorantes o conservantes si est embarazada o buscando quedar embarazada si est amamantando a un beb Cmo debo [employer]? Este dispositivo se inserta debajo de la piel en la cara  interna de la parte superior del brazo por un profesional  de [employer]. Hable con su pediatra para informarse acerca del uso de este medicamento en nios. Puede requerir atencin especial. Sobredosis: Pngase en contacto inmediatamente con un centro toxicolgico o una sala de urgencia si usted cree que haya tomado demasiado medicamento. ATENCIN: [employer] es solo para usted. No comparta este medicamento con nadie. Qu sucede si me olvido de una dosis? No se aplica en este caso. Qu puede interactuar con este medicamento? No tome esta medicina con ninguno de los siguientes medicamentos: amprenavir bosentano fosamprenavir Esta medicina tambin puede interactuar con los siguientes medicamentos: medicamentos barbitricos para inducir el sueo o tratar convulsiones ciertos medicamentos para las infecciones micticas tales como quetoconazol e itraconazol griseofulvina medicamentos para tratar convulsiones, tales como carbamazepina, felbamato, Agricultural engineer, fenitona, topiramato modafinil fenilbutazona rifampicina algunos medicamentos para tratar la infeccin por VIH tales como atazanavir, indinavir, lopinavir, nelfinavir, tipranavir, ritonavir hierba de [address] ser que esta lista no menciona todas las posibles interacciones. Informe a su profesional de [employer] de [employer] productos a base de hierbas, medicamentos de [place] o suplementos nutritivos que est tomando. Si usted fuma, consume bebidas alcohlicas o si utiliza drogas ilegales, indqueselo tambin a su profesional de [employer]. Algunas sustancias pueden interactuar con su medicamento. A qu debo estar atento al usar [employer]? Este producto no protege contra la infeccin por el VIH (SIDA) u otras enfermedades de transmisin sexual. Usted debe sentir el implante al presionar con las yemas de los dedos sobre la piel donde se insert. Contacte a su mdico si no se siente el implante y [place] un mtodo anticonceptivo no  hormonal (como el condn) hasta que el mdico confirma que el implante est en su Environmental consultant. Si siente que el implante puede haber roto o doblado en su brazo, pngase en contacto con su proveedor de atencin mdica. Qu efectos secundarios puedo tener al [employer] este medicamento? Efectos secundarios que debe informar a su mdico o a Producer, television/film/video de la salud tan pronto como sea posible: Therapist, art como erupcin cutnea, picazn o urticarias, hinchazn de la cara, labios o lengua ndulos mamarios cambios de emociones o humor humor deprimido sangrado menstrual prolongado o abundante dolor, irritacin, hichazn o Therapist, music de la insercin Immunologist de la insercin signos de Human resources officer de la insercin, tales como fiebre y Comptroller, Engineer, mining o descarga de la piel signos de Psychiatrist signos y sntomas de un cogulo sanguneo tales como problemas respiratorios; cambios en la visin; dolor en el pecho; dolor de cabeza severo, repentino; dolor, hinchazn, clida en la pierna; dificultad para hablar; entumecimiento o debilidad repentina de la cara, brazo o pierna signos y sntomas de lesin al hgado como orina amarillo oscuro o Child psychotherapist; sensacin general de estar enfermo o sntomas gripales; heces claras; prdida de apetito; nuseas; dolor en la regin abdominal superior derecha; cansancio o debilidad inusual; color amarillento de los ojos o la piel sangrado, flujo vaginal inusual signos y sntomas de un derrame cerebral tales como cambios en la visin; confusin; dificultad para hablar o entender; dolores de cabeza severos; entumecimiento o debilidad repentina de la cara, brazo o pierna; dificultad para andar; Research scientist (life sciences); prdida del equilibrio o coordinacin Efectos secundarios que, por lo general, no requieren Psychologist, prison and probation services (debe informarlos a su mdico o a Producer, television/film/video de la salud si persisten o si son molestos): acn dolor de espalda dolor de pecho cambios de peso  mareos sensacin general de estar enfermo o sntomas gripales dolor de Training and development officer  sangrado menstrual irregular nuseas dolor de garganta irritacin o inflamacin vaginal Puede ser que esta lista no menciona todos los posibles efectos secundarios. Comunquese a su mdico por asesoramiento mdico [employer]. Usted puede informar los efectos secundarios a la FDA por telfono al 1-800-FDA-1088. Dnde debo guardar mi medicina? Este medicamento se administra en hospitales o clnicas y no necesitar guardarlo en su domicilio. ATENCIN: Este folleto es un resumen. Puede ser que no cubra toda la posible informacin. Si usted tiene preguntas acerca de esta medicina, consulte con su mdico, su farmacutico o su profesional de Radiographer, therapeutic.    2016, Elsevier/Gold Standard. (2014-10-21 00:00:00)

## 2015-10-11 NOTE — Assessment & Plan Note (Signed)
-   Advised patient to start taking metformin with dinner, as well, starting with one 500 mg pill at night for a week and increasing to 2 pills the next week. - Prescribed metformin 1000 mg for patient to continue taking after finishing bottle of 500 mg.

## 2015-10-25 ENCOUNTER — Ambulatory Visit: Payer: Self-pay | Admitting: Internal Medicine

## 2015-10-25 ENCOUNTER — Ambulatory Visit: Payer: Self-pay

## 2015-11-02 DIAGNOSIS — Z789 Other specified health status: Secondary | ICD-10-CM

## 2015-11-02 NOTE — Progress Notes (Signed)
HEART WISE FOLLOW UP: Patient was called per Delorise Royals to check on how doing with Monitoring blood pressure and whether had mailed in results. Patient stated that had mailed in the results of BP and blood pressure was finally down since started on HCTZ 25 mg. Stated is not having any problems with monitor.  PLAN: Will call back to follow up April 7.

## 2016-01-05 ENCOUNTER — Telehealth: Payer: Self-pay

## 2016-01-05 NOTE — Telephone Encounter (Signed)
Patient called by Elizabeth RoyalsJulie Mora interpreter today for Health Coaching regarding [facility]pert[facility].  DIABETES: Did not explore but patient did not go to eligibility appointment and [facility]   MONITORING BLOOD PRESSURE:  Dates for blood pressure checking were placed on daily and weekly sheets and gone over. Follow up dates were given to patient. Patient stated had not finished sheet but should of been finished 12/14/15. Patient states is not having any difficulty with taking BP and has continued to check blood pressure everyday.  PLAN: Will call soon. 4-6 week follow up when completes. Need to have patient bring in monitor to check memory and explore if is following diabetes. More health coachings and follow up assessment.

## 2016-01-06 ENCOUNTER — Telehealth: Payer: Self-pay

## 2016-01-06 NOTE — Telephone Encounter (Signed)
Patient stated that would call interpreter back on 01/05/16. Interpreter [name] call patient back on 01/06/16 and child answered phone. Child asked who was calling and could hear mother in background telling daughter to tell that she was not at home. Interpreter will text patient to see if can bring in monitor to check it's memory.  PLAN: Will call patient back soon.

## 2016-01-07 ENCOUNTER — Telehealth: Payer: Self-pay

## 2016-01-07 NOTE — Telephone Encounter (Signed)
SECOND COACHING RELATED TO HEART WISE PROGRAM: Patient called Elizabeth RoyalsJulie Sowell interpreter to explain what had happened on 4/27. Interpreter informed patient that could bring in the machine and write numbers on another [facility]. Patient stated that would bring it in on Monday.

## 2016-01-28 ENCOUNTER — Telehealth: Payer: Self-pay

## 2016-01-28 NOTE — Telephone Encounter (Addendum)
Patient called per [name] interpreter for final assessment.  ASSESSMENT :This is a follow up Assessment following [facility] . Marland Kitchen.  Medication Status : Patient states is not taking medication for high cholesterol. Patient states that takes medicine for hypertension and diabetes.   Blood Pressure, Self-measurement: Patient states has not been told to check Blood pressure and is not checking. Reminded patient that has BP monitor and should be checking blood pressure at least weekly.  Nutrition Assessment: Patient stated that does not eat fruit every day. Patient states she eats 2 servings of vegetables a day. Per Patient eats 3 or more ounces of whole grains daily. Patient stated does eat two or more servings of fish weekly.  Patient states she does not drink more than 36 ounces or 450 calories of beverages with added sugars weekly. Patient stated she does watch her salt intake.   Physical Activity Assessment: Patient stated she does around 140 minutes of moderate activity and 120 minutes of vigorous per week.  Smoking Status: Patient stated has never smoked and is not exposed to smoke.  Quality of Life Assessment: In assessing patient's Physical quality of life she stated that out of the past 30 days that she has felt her health is good all of them. Patient also stated that in the past 30 days that her mental health was good including stress, depression and problems with emotions for all days. Patient did state that out of the past 30 days she felt her physical or mental health had not kept her from doing her usual activities including self-care, work or recreation.   PLAN: Informed patient that would re screen if does not have insurance when and [facility].  TIME SPENT with PATIENT: 15 minutes

## 2017-06-18 ENCOUNTER — Encounter (HOSPITAL_COMMUNITY): Payer: Self-pay

## 2017-08-14 ENCOUNTER — Emergency Department (HOSPITAL_COMMUNITY): Payer: Self-pay

## 2017-08-14 ENCOUNTER — Emergency Department (HOSPITAL_COMMUNITY)
Admission: EM | Admit: 2017-08-14 | Discharge: 2017-08-14 | Disposition: A | Discharge status: Home / Self Care | Payer: Self-pay | Attending: Emergency Medicine | Admitting: Emergency Medicine

## 2017-08-14 ENCOUNTER — Encounter (HOSPITAL_COMMUNITY): Payer: Self-pay | Admitting: Emergency Medicine

## 2017-08-14 ENCOUNTER — Other Ambulatory Visit: Payer: Self-pay

## 2017-08-14 DIAGNOSIS — E119 Type 2 diabetes mellitus without complications: Secondary | ICD-10-CM | POA: Insufficient documentation

## 2017-08-14 DIAGNOSIS — M542 Cervicalgia: Secondary | ICD-10-CM | POA: Insufficient documentation

## 2017-08-14 DIAGNOSIS — Z7984 Long term (current) use of oral hypoglycemic drugs: Secondary | ICD-10-CM | POA: Insufficient documentation

## 2017-08-14 DIAGNOSIS — I10 Essential (primary) hypertension: Secondary | ICD-10-CM | POA: Insufficient documentation

## 2017-08-14 HISTORY — DX: Essential (primary) hypertension: I10

## 2017-08-14 MED ORDER — METHOCARBAMOL 500 MG PO TABS: 500.0000 mg | ORAL_TABLET | Freq: Two times a day (BID) | ORAL | 0 refills | Status: AC

## 2017-08-14 MED ORDER — DICLOFENAC SODIUM 75 MG PO TBEC: 75.0000 mg | DELAYED_RELEASE_TABLET | Freq: Two times a day (BID) | ORAL | 0 refills | Status: AC

## 2017-08-14 NOTE — ED Notes (Signed)
Pt reports that she has pain and tightness the back of  neck and at time the pain moves to her head in where it is sharp and sudden pt does reports some left arm pain from her shoulder to her forearm and occasional has a feeling as if it falls asleep. Pt also reports that she has abdominal pain that is described as cramping " contraction" that has been ongoing x 2 months that is intermittent. . Pt d. Pt reports that she has had these symptoms x 3 weeks. Pt reports that she has occasional nausea but no vomiting .

## 2017-08-14 NOTE — ED Provider Notes (Signed)
[facility]-EMERGENCY DEPT Provider Note   CSN: [id number] Arrival date & time: 08/14/17  1820     History   Chief Complaint Chief Complaint  Patient presents with  . Neck Pain    HPI Kaysia Lynder ParentsGaldina [place]ter[place] is a 46 y.o. female.  The history is provided by the patient. A language interpreter was used.  Neck Pain   This is a new problem. Episode onset: 3 weeks. The problem occurs constantly. The problem has been gradually worsening. The pain is associated with nothing. There has been no fever. The pain is present in the generalized neck. The quality of the pain is described as aching. The pain radiates to the left shoulder and right shoulder. The pain is moderate. The symptoms are aggravated by bending. Pertinent negatives include no numbness. She has tried nothing for the symptoms. The treatment provided no relief.  Pt complains of pain in her neck for 3 weeks.  Pain radaites down left arm  Past Medical History:  Diagnosis Date  . DUB (dysfunctional uterine bleeding)   . Gestational diabetes   . Hypertension     Patient Active Problem List   Diagnosis Date Noted  . Birth control 10/11/2015  . Diabetes (HCC) 09/27/2015  . HTN (hypertension) 09/27/2015  . 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 Oligomenorrhea 06/29/2011  . Menorrhagia with irregular cycle 06/29/2011    History reviewed. No pertinent surgical history.  OB History    Gravida Para Term Preterm AB Living   4 3 3   1 3    SAB TAB Ectopic Multiple Live Births   1       1       Home Medications    Prior to Admission medications   Medication Sig Start Date End Date Taking? Authorizing Provider  hydrochlorothiazide (HYDRODIURIL) 25 MG tablet Take 1 tablet (25 mg total) by mouth daily. 10/11/15   Casey BurkittFitzgerald, Hillary Moen, MD  metFORMIN (GLUCOPHAGE) 1000 MG tablet Take 1 tablet (1,000 mg total) by mouth 2 (two) times daily with a meal. 10/11/15   Casey BurkittFitzgerald, Hillary Moen, MD    Family  History Family History  Problem Relation Age of Onset  . Diabetes Paternal Grandmother     Social History Social History   Tobacco Use  . Smoking status: Never Smoker  . Smokeless tobacco: Never Used  Substance Use Topics  . Alcohol use: No  . Drug use: No     Allergies   Patient has no known allergies.   Review of Systems Review of Systems  Musculoskeletal: Positive for neck pain.  Neurological: Negative for numbness.  All other systems reviewed and are negative.    Physical Exam Updated Vital Signs BP 139/83 (BP Location: Right Arm)   Pulse 92   Temp 98.2 F (36.8 C) (Oral)   Ht 5\' 4"  (1.626 m)   Wt 63.5 kg (140 lb)   SpO2 99%   BMI 24.03 kg/m   Physical Exam  Constitutional: She appears well-developed and well-nourished. No distress.  HENT:  Head: Normocephalic and atraumatic.  Eyes: Conjunctivae are normal.  Neck: Neck supple.  Tender c spine diffusely,  Pain with movement,  nv and ns intact  Cardiovascular: Normal rate and regular rhythm.  No murmur heard. Pulmonary/Chest: Effort normal and breath sounds normal. No respiratory distress.  Abdominal: Soft. There is no tenderness.  Musculoskeletal: She exhibits no edema.  Neurological: She is alert.  Skin: Skin is warm and dry.  Psychiatric: She has a normal mood and  affect.  Nursing note and vitals reviewed.    ED Treatments / Results  Labs (all labs ordered are listed, but only abnormal results are displayed) Labs Reviewed - No data to display  EKG  EKG Interpretation None       Radiology Dg Cervical Spine Complete  Result Date: 08/14/2017 CLINICAL DATA:  Neck and left shoulder pain without known injury. EXAM: CERVICAL SPINE - COMPLETE 4+ VIEW COMPARISON:  None. FINDINGS: There is no evidence of cervical spine fracture or prevertebral soft tissue swelling. Alignment is normal. No other significant bone abnormalities are identified. IMPRESSION: Negative cervical spine radiographs.  Electronically Signed   By: Lupita RaiderJames  Green Jr, M.D.   On: 08/14/2017 21:15    Procedures Procedures (including critical care time)  Medications Ordered in ED Medications - No data to display   Initial Impression / Assessment and Plan / ED Course  I have reviewed the triage vital signs and the nursing notes.  Pertinent labs & imaging results that were available during my care of the patient were reviewed by me and considered in my medical decision making (see chart for details).     Meds ordered this encounter  Medications  . methocarbamol (ROBAXIN) 500 MG tablet    Sig: Take 1 tablet (500 mg total) by mouth 2 (two) times daily.    Dispense:  20 tablet    Refill:  0    Order Specific Question:   Supervising Provider    Answer:   MILLER, BRIAN [[id number]]  . diclofenac (VOLTAREN) 75 MG EC tablet    Sig: Take 1 tablet (75 mg total) by mouth 2 (two) times daily.    Dispense:  20 tablet    Refill:  0    Order Specific Question:   Supervising Provider    Answer:   Eber HongMILLER, BRIAN [[id number]]    Final Clinical Impressions(s) / ED Diagnoses   Final diagnoses:  Neck pain    ED Discharge Orders    None    An After Visit Summary was printed and given to the patient.    Osie CheeksSofia, Victorious Kundinger K, PA-C 08/14/17 2154    Charlynne PanderYao, David Hsienta, MD 08/14/17 2225

## 2017-08-14 NOTE — ED Triage Notes (Signed)
Pt reports cervical pain x3 weeks, she denies injury but states it may be due to stress and working a lot. Pt ambulatory to triage. ROM intact.

## 2017-08-14 NOTE — Discharge Instructions (Signed)
See your Physician for recheck in 1 week  °

## 2019-01-22 ENCOUNTER — Telehealth: Payer: Self-pay | Admitting: Pediatric Intensive Care

## 2019-01-22 NOTE — Telephone Encounter (Signed)
Via [facility] interpreter- client has had sharp pains in neck for several days. She experiences a headache which is relieved somewhat with Excedrine. She states that she has a history of diabetes and high blood pressure. She endorses a history of depression especially since losing her employment at start of Covid emergency. She feels that she is handling the stress well but sometimes becomes sad. Client states that she is a clinic patient at [facility]. Client states that she has adequate medication for diabetes and HTN but that she would like to talk to her provider regarding the neck pain and headaches. She consents to CN calling clinic. CN will refer client to [facility] program for evaluation of depression. Follow up call on 5/19 at 1230. Shann Medal RN BSN CNP [phone]

## 2019-01-27 ENCOUNTER — Telehealth: Payer: Self-pay | Admitting: Pediatric Intensive Care

## 2019-01-27 NOTE — Telephone Encounter (Signed)
Spoke with appointment scheduler regarding client needs. Scheduler will speak with triage nurse regarding client and call client directly regarding appointment. Shann Medal RN BSN CNP [phone]

## 2019-01-29 ENCOUNTER — Telehealth: Payer: Self-pay | Admitting: Pediatric Intensive Care

## 2019-01-29 NOTE — Telephone Encounter (Signed)
Via interpreter [facility]- CN advised client that [facility] staff would be calling to arrange appointment for her. Client states that [facility] staff called and explained process for appointment. CN gave information regarding [facility] program including phone number and website. Client states that she feels less depressed this week but she will call the program. CN advised client to follow up with her if any further questions or concerns. Shann Medal RN BSN CNP [phone]

## 2024-09-08 ENCOUNTER — Emergency Department (HOSPITAL_COMMUNITY)
Admission: EM | Admit: 2024-09-08 | Discharge: 2024-09-08 | Disposition: A | Discharge status: Home / Self Care | Payer: Self-pay | Attending: Emergency Medicine | Admitting: Emergency Medicine

## 2024-09-08 ENCOUNTER — Other Ambulatory Visit: Payer: Self-pay

## 2024-09-08 ENCOUNTER — Emergency Department (HOSPITAL_COMMUNITY): Payer: Self-pay

## 2024-09-08 ENCOUNTER — Encounter (HOSPITAL_COMMUNITY): Payer: Self-pay

## 2024-09-08 DIAGNOSIS — I10 Essential (primary) hypertension: Secondary | ICD-10-CM | POA: Insufficient documentation

## 2024-09-08 DIAGNOSIS — E119 Type 2 diabetes mellitus without complications: Secondary | ICD-10-CM | POA: Insufficient documentation

## 2024-09-08 DIAGNOSIS — D259 Leiomyoma of uterus, unspecified: Secondary | ICD-10-CM

## 2024-09-08 DIAGNOSIS — Z79899 Other long term (current) drug therapy: Secondary | ICD-10-CM | POA: Insufficient documentation

## 2024-09-08 DIAGNOSIS — D219 Benign neoplasm of connective and other soft tissue, unspecified: Secondary | ICD-10-CM

## 2024-09-08 DIAGNOSIS — Z7984 Long term (current) use of oral hypoglycemic drugs: Secondary | ICD-10-CM | POA: Insufficient documentation

## 2024-09-08 LAB — URINALYSIS, ROUTINE W REFLEX MICROSCOPIC
Bilirubin Urine: NEGATIVE
Glucose, UA: >=500 mg/dL — AB
Hgb urine dipstick: NEGATIVE
Ketones, ur: 80 mg/dL — AB
Leukocytes,Ua: NEGATIVE
Nitrite: NEGATIVE
Protein, ur: NEGATIVE mg/dL
Specific Gravity, Urine: 1.022 (ref 1.005–1.030)
pH: 6 (ref 5.0–8.0)

## 2024-09-08 LAB — COMPREHENSIVE METABOLIC PANEL WITH GFR
ALT: 14 U/L (ref 0–44)
AST: 17 U/L (ref 15–41)
Albumin: 4.2 g/dL (ref 3.5–5.0)
Alkaline Phosphatase: 86 U/L (ref 38–126)
Anion gap: 15 (ref 5–15)
BUN: 15 mg/dL (ref 6–20)
CO2: 20 mmol/L — ABNORMAL LOW (ref 22–32)
Calcium: 8.9 mg/dL (ref 8.9–10.3)
Chloride: 97 mmol/L — ABNORMAL LOW (ref 98–111)
Creatinine, Ser: 0.53 mg/dL (ref 0.44–1.00)
GFR, Estimated: >60 mL/min
Glucose, Bld: 331 mg/dL — ABNORMAL HIGH (ref 70–99)
Potassium: 4.2 mmol/L (ref 3.5–5.1)
Sodium: 132 mmol/L — ABNORMAL LOW (ref 135–145)
Total Bilirubin: 0.3 mg/dL (ref 0.0–1.2)
Total Protein: 7.9 g/dL (ref 6.5–8.1)

## 2024-09-08 LAB — CBC WITH DIFFERENTIAL/PLATELET
Abs Immature Granulocytes: 0.05 K/uL (ref 0.00–0.07)
Basophils Absolute: 0 K/uL (ref 0.0–0.1)
Basophils Relative: 0 %
Eosinophils Absolute: 0 K/uL (ref 0.0–0.5)
Eosinophils Relative: 0 %
HCT: 43 % (ref 36.0–46.0)
Hemoglobin: 14.2 g/dL (ref 12.0–15.0)
Immature Granulocytes: 1 %
Lymphocytes Relative: 8 %
Lymphs Abs: 0.8 K/uL (ref 0.7–4.0)
MCH: 29.8 pg (ref 26.0–34.0)
MCHC: 33 g/dL (ref 30.0–36.0)
MCV: 90.1 fL (ref 80.0–100.0)
Monocytes Absolute: 0.2 K/uL (ref 0.1–1.0)
Monocytes Relative: 2 %
Neutro Abs: 9.1 K/uL — ABNORMAL HIGH (ref 1.7–7.7)
Neutrophils Relative %: 89 %
Platelets: 276 K/uL (ref 150–400)
RBC: 4.77 MIL/uL (ref 3.87–5.11)
RDW: 12.3 % (ref 11.5–15.5)
WBC: 10.2 K/uL (ref 4.0–10.5)
nRBC: 0 % (ref 0.0–0.2)

## 2024-09-08 LAB — LIPASE, BLOOD: Lipase: 49 U/L (ref 11–51)

## 2024-09-08 LAB — I-STAT CHEM 8, ED
BUN: 16 mg/dL (ref 6–20)
Calcium, Ion: 0.98 mmol/L — ABNORMAL LOW (ref 1.15–1.40)
Chloride: 99 mmol/L (ref 98–111)
Creatinine, Ser: 0.4 mg/dL — ABNORMAL LOW (ref 0.44–1.00)
Glucose, Bld: 336 mg/dL — ABNORMAL HIGH (ref 70–99)
HCT: 45 % (ref 36.0–46.0)
Hemoglobin: 15.3 g/dL — ABNORMAL HIGH (ref 12.0–15.0)
Potassium: 4.3 mmol/L (ref 3.5–5.1)
Sodium: 135 mmol/L (ref 135–145)
TCO2: 21 mmol/L — ABNORMAL LOW (ref 22–32)

## 2024-09-08 LAB — PREGNANCY, URINE: Preg Test, Ur: NEGATIVE

## 2024-09-08 MED ORDER — IOHEXOL 350 MG/ML SOLN
75.0000 mL | Freq: Once | INTRAVENOUS | Status: AC | PRN
  Administered 2024-09-08: 75 mL via INTRAVENOUS

## 2024-09-08 MED ORDER — SODIUM CHLORIDE 0.9 % IV BOLUS
1000.0000 mL | Freq: Once | INTRAVENOUS | Status: AC
  Administered 2024-09-08: 1000 mL via INTRAVENOUS

## 2024-09-08 MED ORDER — IBUPROFEN 600 MG PO TABS: 600.0000 mg | ORAL_TABLET | Freq: Four times a day (QID) | ORAL | 0 refills | Status: AC | PRN

## 2024-09-08 MED ORDER — OXYCODONE-ACETAMINOPHEN 5-325 MG PO TABS
1.0000 | ORAL_TABLET | Freq: Once | ORAL | Status: AC
  Administered 2024-09-08: 1 via ORAL
  Filled 2024-09-08: qty 1

## 2024-09-08 NOTE — ED Provider Triage Note (Signed)
 Emergency Medicine Provider Triage Evaluation Note  Elizabeth Mora , a 53 y.o. female  was evaluated in triage.  Pt complains of abd pain. RLQ abd pain started today with associate nausea and vomiting.  No fever, chills, cp, sob, cough, dysuria, hematuria, diarrhea or constipation.  No vaginal bleeding or vaginal discharge.  Has intact appendix  Review of Systems  Positive: As above Negative: As above  Physical Exam  BP (!) 156/82 (BP Location: Right Arm)   Pulse 70   Temp (!) 97.4 F (36.3 C)   Resp (!) 22   SpO2 100%  Gen:   Awake, appears uncomfortable Resp:  Normal effort  MSK:   Moves extremities without difficulty  Other:    Medical Decision Making  Medically screening exam initiated at 1:19 PM.  Appropriate orders placed.  Elizabeth Mora was informed that the remainder of the evaluation will be completed by another provider, this initial triage assessment does not replace that evaluation, and the importance of remaining in the ED until their evaluation is complete.  ?appy   Nivia Colon, PA-C 09/08/24 1320

## 2024-09-08 NOTE — ED Notes (Signed)
 Taken to [place].

## 2024-09-08 NOTE — ED Provider Notes (Signed)
 " [place] [facility] Provider Note   CSN: [id number] Arrival date & time: 09/08/24  1128     Patient presents with: No chief complaint on file.   Elizabeth Mora is a 53 y.o. female.   Pt is a 53 yo female with pmhx significant for DM and HTN.  She developed RLQ with nausea this am.  She denies any other sx.  She did not take her metformin  this am.  She was given a percocet in triage and that relieved her pain.  Pt is still having periods.  Due to language barrier, an interpreter was present during the history-taking and subsequent discussion (and for part of the physical exam) with this patient.        Prior to Admission medications  Medication Sig Start Date End Date Taking? Authorizing Provider  ibuprofen  (ADVIL ) 600 MG tablet Take 1 tablet (600 mg total) by mouth every 6 (six) hours as needed. 09/08/24  Yes Dean Clarity, MD  diclofenac  (VOLTAREN ) 75 MG EC tablet Take 1 tablet (75 mg total) by mouth 2 (two) times daily. 08/14/17   Sofia, Leslie K, PA-C  hydrochlorothiazide  (HYDRODIURIL ) 25 MG tablet Take 1 tablet (25 mg total) by mouth daily. 10/11/15   Epifanio Rolland Robin, MD  metFORMIN  (GLUCOPHAGE ) 1000 MG tablet Take 1 tablet (1,000 mg total) by mouth 2 (two) times daily with a meal. 10/11/15   Epifanio Rolland Robin, MD  methocarbamol  (ROBAXIN ) 500 MG tablet Take 1 tablet (500 mg total) by mouth 2 (two) times daily. 08/14/17   Sofia, Leslie K, PA-C    Allergies: Patient has no known allergies.    Review of Systems  Gastrointestinal:  Positive for abdominal pain and nausea.  All other systems reviewed and are negative.   Updated Vital Signs BP 136/87   Pulse 69   Temp 97.6 F (36.4 C)   Resp 20   SpO2 100%   Physical Exam Vitals and nursing note reviewed.  Constitutional:      Appearance: Normal appearance.  HENT:     Head: Normocephalic and atraumatic.     Right Ear: External ear normal.     Left Ear:  External ear normal.     Nose: Nose normal.     Mouth/Throat:     Mouth: Mucous membranes are moist.     Pharynx: Oropharynx is clear.  Eyes:     Extraocular Movements: Extraocular movements intact.     Conjunctiva/sclera: Conjunctivae normal.     Pupils: Pupils are equal, round, and reactive to light.  Cardiovascular:     Rate and Rhythm: Normal rate and regular rhythm.     Pulses: Normal pulses.     Heart sounds: Normal heart sounds.  Pulmonary:     Effort: Pulmonary effort is normal.     Breath sounds: Normal breath sounds.  Abdominal:     General: Abdomen is flat. Bowel sounds are normal.     Palpations: Abdomen is soft.     Tenderness: There is abdominal tenderness in the right lower quadrant.  Musculoskeletal:        General: Normal range of motion.     Cervical back: Normal range of motion and neck supple.  Skin:    General: Skin is warm.     Capillary Refill: Capillary refill takes less than 2 seconds.  Neurological:     General: No focal deficit present.     Mental Status: She is alert and oriented to person, place,  and time.  Psychiatric:        Mood and Affect: Mood normal.        Behavior: Behavior normal.     (all labs ordered are listed, but only abnormal results are displayed) Labs Reviewed  URINALYSIS, ROUTINE W REFLEX MICROSCOPIC - Abnormal; Notable for the following components:      Result Value   Color, Urine STRAW (*)    Glucose, UA >=500 (*)    Ketones, ur 80 (*)    Bacteria, UA RARE (*)    All other components within normal limits  CBC WITH DIFFERENTIAL/PLATELET - Abnormal; Notable for the following components:   Neutro Abs 9.1 (*)    All other components within normal limits  COMPREHENSIVE METABOLIC PANEL WITH GFR - Abnormal; Notable for the following components:   Sodium 132 (*)    Chloride 97 (*)    CO2 20 (*)    Glucose, Bld 331 (*)    All other components within normal limits  I-STAT CHEM 8, ED - Abnormal; Notable for the following  components:   Creatinine, Ser 0.40 (*)    Glucose, Bld 336 (*)    Calcium, Ion 0.98 (*)    TCO2 21 (*)    Hemoglobin 15.3 (*)    All other components within normal limits  PREGNANCY, URINE  LIPASE, BLOOD  CBC WITH DIFFERENTIAL/PLATELET    EKG: None  Radiology: US  Pelvis Complete Result Date: 09/08/2024 EXAM: US  Pelvis, Complete Transvaginal and Transabdominal with Doppler 09/08/2024 TECHNIQUE: Both transabdominal and transvaginal ultrasound examinations of the pelvis were performed. Transabdominal technique was performed for global imaging of the pelvis including uterus, ovaries, adnexal regions, and pelvic cul-de-sac. It was necessary to proceed with endovaginal exam following the transabdominal exam to visualize the endometrium and ovaries bilaterally. Duplex sonography was performed using B-mode/gray scaled imaging with Doppler spectral waveform analysis and color flow obtained. COMPARISON: None available CLINICAL HISTORY: rlq pain FINDINGS: UTERUS: The uterus is anteverted. The cervix is unremarkable. No intrauterine masses are seen. The uterus measures 8.7 x 3.5 x 5.1 cm with a volume of 80.6 cc. Uterus demonstrates normal myometrial echotexture. ENDOMETRIAL STRIPE: Endometrial measures . Endometrial stripe is within normal limits. RIGHT OVARY: The right ovary is seen adjacent to, but separate from the dominant mass within the right adnexa measuring 4.2 x 2.8 x 2.1 cm with a volume of 12 cc. The ovary demonstrates normal Spectral Doppler arterial and venous waveforms. There is normal vascularity on color Doppler examination. There is, however, a clear torsion knot within the right adnexa and there is no vascularity associated with the dominant solid mass within the right adnexa which measures 7.4 x 7.8 x 6.7 cm. 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 This could be confirmed with  contrast-enhanced MRI examination. LEFT OVARY: No left adnexal or ovarian mass. The left ovary measures 2.3 x 1.4 x 1.8 cm with a volume of 3.0 cc. There is normal vascularity on color Doppler examination. Spectral Doppler arterial and venous waveforms are normal. FREE FLUID: No free fluid. IMPRESSION: 1. 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 2. Right ovary is adjacent to but separate from the mass, with normal vascularity and normal arterial and venous Doppler waveforms. Electronically signed by: Dorethia Molt MD 09/08/2024 09:28 PM  EST RP Workstation: [id number]   US  Art/Ven Flow Abd Pelv Doppler Result Date: 09/08/2024 EXAM: US  Pelvis, Complete Transvaginal and Transabdominal with Doppler 09/08/2024 TECHNIQUE: Both transabdominal and transvaginal ultrasound examinations of the pelvis were performed. Transabdominal technique was performed for global imaging of the pelvis including uterus, ovaries, adnexal regions, and pelvic cul-de-sac. It was necessary to proceed with endovaginal exam following the transabdominal exam to visualize the endometrium and ovaries bilaterally. Duplex sonography was performed using B-mode/gray scaled imaging with Doppler spectral waveform analysis and color flow obtained. COMPARISON: None available CLINICAL HISTORY: rlq pain FINDINGS: UTERUS: The uterus is anteverted. The cervix is unremarkable. No intrauterine masses are seen. The uterus measures 8.7 x 3.5 x 5.1 cm with a volume of 80.6 cc. Uterus demonstrates normal myometrial echotexture. ENDOMETRIAL STRIPE: Endometrial measures . Endometrial stripe is within normal limits. RIGHT OVARY: The right ovary is seen adjacent to, but separate from the dominant mass within the right adnexa measuring 4.2 x 2.8 x 2.1 cm with a volume of 12 cc. The ovary demonstrates normal Spectral  Doppler arterial and venous waveforms. There is normal vascularity on color Doppler examination. There is, however, a clear torsion knot within the right adnexa and there is no vascularity associated with the dominant solid mass within the right adnexa which measures 7.4 x 7.8 x 6.7 cm. 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 This could be confirmed with contrast-enhanced MRI examination. LEFT OVARY: No left adnexal or ovarian mass. The left ovary measures 2.3 x 1.4 x 1.8 cm with a volume of 3.0 cc. There is normal vascularity on color Doppler examination. Spectral Doppler arterial and venous waveforms are normal. FREE FLUID: No free fluid. IMPRESSION: 1. 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 2. Right ovary is adjacent to but separate from the mass, with normal vascularity and normal arterial and venous Doppler waveforms. Electronically signed by: Dorethia Molt MD 09/08/2024 09:28 PM EST RP Workstation: [id number]   CT ABDOMEN PELVIS W CONTRAST Result Date: 09/08/2024 EXAM: CT ABDOMEN AND PELVIS WITH CONTRAST 09/08/2024 04:00:52 PM TECHNIQUE: CT of the abdomen and pelvis was performed with the administration of intravenous contrast. 75 mL of iohexol  (OMNIPAQUE ) 350 MG/ML injection was administered. Multiplanar reformatted images are provided for review. Automated exposure control, iterative reconstruction, and/or weight-based adjustment of the mA/kV was utilized to reduce the radiation dose to as low as reasonably achievable. COMPARISON: 05/29/2011 CLINICAL HISTORY: RLQ abdominal pain. FINDINGS: LOWER CHEST: No acute abnormality. LIVER: The liver is unremarkable. GALLBLADDER AND BILE DUCTS: Multiple stones within the gallbladder, the largest 2 cm. No  biliary ductal dilatation. SPLEEN: No acute abnormality. PANCREAS: No acute abnormality. ADRENAL GLANDS: No acute abnormality. KIDNEYS, URETERS AND BLADDER: No stones in the kidneys or ureters. No hydronephrosis. No perinephric or periureteral stranding. Urinary bladder is unremarkable. GI AND BOWEL: Stomach demonstrates no acute abnormality. There is no bowel obstruction. Normal appendix. PERITONEUM AND RETROPERITONEUM: Small amount of free fluid in the pelvis. No free air. VASCULATURE: Aorta is normal in caliber. LYMPH NODES: No lymphadenopathy. REPRODUCTIVE ORGANS: Large predominantly low density heterogeneous mass centrally in the pelvis measures 8.4 x 7.9 x 7.7 cm. It is difficult to determine if this is associated with the right ovary or could reflect a pedunculated fibroid off the anterior uterus. This  can be further evaluated with pelvic ultrasound. BONES AND SOFT TISSUES: No acute osseous abnormality. No focal soft tissue abnormality. IMPRESSION: 1. Large predominantly low density heterogeneous pelvic mass measuring 8.4 x 7.9 x 7.7 cm, indeterminate for right ovarian origin versus pedunculated fibroid arising from the anterior uterus. Further evaluation with pelvic ultrasound is recommended. 2. Small amount of free fluid in the pelvis. 3. Cholelithiasis. Electronically signed by: Franky Crease MD 09/08/2024 06:09 PM EST RP Workstation: [id number]     Procedures   Medications Ordered in the ED  oxyCODONE -acetaminophen  (PERCOCET/ROXICET) 5-325 MG per tablet 1 tablet (1 tablet Oral Given 09/08/24 1323)  iohexol  (OMNIPAQUE ) 350 MG/ML injection 75 mL (75 mLs Intravenous Contrast Given 09/08/24 1554)  sodium chloride  0.9 % bolus 1,000 mL (0 mLs Intravenous Stopped 09/08/24 1822)                                    Medical Decision Making Amount and/or Complexity of Data Reviewed Radiology: ordered.   This patient presents to the ED for concern of abd pain, this involves an extensive number of  treatment options, and is a complaint that carries with it a high risk of complications and morbidity.  The differential diagnosis includes appy, kidney stone, pregnancy, cholelithiasis, ovarian cyst   Co morbidities that complicate the patient evaluation  DM and HTN   Additional history obtained:  Additional history obtained from epic chart review    Lab Tests:  I Ordered, and personally interpreted labs.  The pertinent results include:  cbc nl; I-stat chem-8 with glucose elevated at 336; ua + ketones and glucose   Imaging Studies ordered:  I ordered imaging studies including ct abd/pelvis and pelvic US  I independently visualized and interpreted imaging which showed  CT abd/pelvis: . Large predominantly low density heterogeneous pelvic mass measuring 8.4 x  7.9 x 7.7 cm, indeterminate for right ovarian origin versus pedunculated  fibroid arising from the anterior uterus. Further evaluation with pelvic  ultrasound is recommended.  2. Small amount of free fluid in the pelvis.  3. Cholelithiasis.  US : Dominant solid right adnexal mass measuring 7.4 x 7.8 x 6.7 cm with torsion  knot and absent vascularity, favored to represent a torsed broad ligament  fibroid; contrast-enhanced MRI could confirm.  2. Right ovary is adjacent to but separate from the mass, with normal  vascularity and normal arterial and venous Doppler waveforms.   I agree with the radiologist interpretation   Medicines ordered and prescription drug management:  I ordered medication including ivfs/percocet  for sx  Reevaluation of the patient after these medicines showed that the patient improved I have reviewed the patients home medicines and have made adjustments as needed   Test Considered:  Ct/us    Consultations Obtained:  I requested consultation with the gynecologist (Dr.Arnold) ,  and discussed lab and imaging findings as well as pertinent plan - he did see her in consult and recommends d/c  with NSAID and outpatient f/u   Problem List / ED Course:  Adnexal mass:  likely degenerating fibroid.  Pt is stable for d/c.  Return if worse.   Reevaluation:  After the interventions noted above, I reevaluated the patient and found that they have :improved   Social Determinants of Health:  Lives at home; Spanish speaker; self pay   Dispostion:  After consideration of the diagnostic results and the patients response to treatment, I feel that the patent  would benefit from discharge with outpatient f/u.       Final diagnoses:  Fibroid    ED Discharge Orders          Ordered    ibuprofen  (ADVIL ) 600 MG tablet  Every 6 hours PRN        09/08/24 2216               Dean Clarity, MD 09/08/24 2216  "

## 2024-09-08 NOTE — ED Notes (Signed)
 Lab called to say the LG was hemolyzed.  Angela-Phleb is sending down the dark green from [facility] to see if that works.

## 2024-09-08 NOTE — ED Triage Notes (Signed)
 Patient in ED with complaints of abdominal pain that started this morning. Family reports she has been nauseous and vomiting. Denies diarrhea. Pain is in the RLQ, daughter states that this has happened before about a month ago but not as bad. Patient is crying in pain at registration.

## 2024-09-08 NOTE — Consult Note (Signed)
 Reason for Consult:RLQ pain Referring Physician: Dean MD  Elizabeth Mora is an 53 y.o. female. [id number] No LMP recorded. Patient has had an injection. Patient states she has regular menses. She reported to ED With RLQ pain and nausea this morning. Her pain is better now after a percocet. 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 Patient has had an injection.    Past Medical History:  Diagnosis Date   DUB (dysfunctional uterine bleeding)    Gestational diabetes    Hypertension     History reviewed. No pertinent surgical history.  Family History  Problem Relation Age of Onset   Diabetes Paternal Grandmother     Social History:  reports that she has never smoked. She has never used smokeless tobacco. She reports that she does not drink alcohol and does not use drugs.  Allergies: Allergies[1]  Medications: I have reviewed the patient's current medications.  Review of Systems  Constitutional: Negative.   Respiratory: Negative.    Cardiovascular: Negative.   Gastrointestinal: Negative.   Genitourinary:  Positive for pelvic pain. Negative for menstrual problem and vaginal bleeding.    Blood pressure 136/87, pulse 69, temperature 97.6 F (36.4 C), resp. rate 20, SpO2 100%. Physical Exam Vitals and nursing note reviewed.  Constitutional:      Appearance: Normal appearance. She is not ill-appearing.  HENT:     Head: Normocephalic and atraumatic.  Cardiovascular:     Rate and Rhythm: Normal rate.  Pulmonary:     Effort: Pulmonary effort is normal.  Abdominal:     General: Abdomen is flat.     Palpations: Abdomen is soft.  Musculoskeletal:     Cervical back: Normal range of motion.  Neurological:     Mental Status: She  is alert.  Psychiatric:        Mood and Affect: Mood normal.        Behavior: Behavior normal.     Results for orders placed or performed during the hospital encounter of 09/08/24 (from the past 48 hours)  Urinalysis, Routine w reflex microscopic -Urine, Clean Catch     Status: Abnormal   Collection Time: 09/08/24  1:29 PM  Result Value Ref Range   Color, Urine STRAW (A) YELLOW   APPearance CLEAR CLEAR   Specific Gravity, Urine 1.022 1.005 - 1.030   pH 6.0 5.0 - 8.0   Glucose, UA >=500 (A) NEGATIVE mg/dL   Hgb urine dipstick NEGATIVE NEGATIVE   Bilirubin Urine NEGATIVE NEGATIVE   Ketones, ur 80 (A) NEGATIVE mg/dL   Protein, ur NEGATIVE NEGATIVE mg/dL   Nitrite NEGATIVE NEGATIVE   Leukocytes,Ua NEGATIVE NEGATIVE   RBC / HPF 0-5 0 - 5 RBC/hpf   WBC, UA 0-5 0 - 5 WBC/hpf   Bacteria, UA RARE (A) NONE SEEN   Squamous Epithelial / HPF 0-5 0 - 5 /HPF   Mucus PRESENT     Comment: Performed at [facility], [address], [place], [place] [address]  Pregnancy, urine     Status: None   Collection Time: 09/08/24  1:29 PM  Result Value Ref Range   Preg Test, Ur NEGATIVE NEGATIVE    Comment:        THE SENSITIVITY OF THIS METHODOLOGY IS >20 mIU/mL. Performed at [facility], [address], [place], [place]  [address]   CBC with Differential/Platelet     Status: Abnormal   Collection Time: 09/08/24  2:35 PM  Result Value Ref Range   WBC 10.2 4.0 - 10.5 K/uL   RBC 4.77 3.87 - 5.11 MIL/uL   Hemoglobin 14.2 12.0 - 15.0 g/dL   HCT 56.9 63.9 - 53.9 %   MCV 90.1 80.0 - 100.0 fL   MCH 29.8 26.0 - 34.0 pg   MCHC 33.0 30.0 - 36.0 g/dL   RDW 87.6 88.4 - 84.4 %   Platelets 276 150 - 400 K/uL   nRBC 0.0 0.0 - 0.2 %   Neutrophils Relative % 89 %   Neutro Abs 9.1 (H) 1.7 - 7.7 K/uL   Lymphocytes Relative 8 %   Lymphs Abs 0.8 0.7 - 4.0 K/uL   Monocytes Relative 2 %   Monocytes Absolute 0.2 0.1 - 1.0 K/uL   Eosinophils Relative 0 %   Eosinophils Absolute 0.0 0.0 - 0.5 K/uL    Basophils Relative 0 %   Basophils Absolute 0.0 0.0 - 0.1 K/uL   Immature Granulocytes 1 %   Abs Immature Granulocytes 0.05 0.00 - 0.07 K/uL    Comment: Performed at [facility], [address], [place], [place] [address]  I-stat chem 8, ED (not at [facility], [facility] or [facility])     Status: Abnormal   Collection Time: 09/08/24  3:36 PM  Result Value Ref Range   Sodium 135 135 - 145 mmol/L   Potassium 4.3 3.5 - 5.1 mmol/L   Chloride 99 98 - 111 mmol/L   BUN 16 6 - 20 mg/dL   Creatinine, Ser 9.59 (L) 0.44 - 1.00 mg/dL   Glucose, Bld 663 (H) 70 - 99 mg/dL    Comment: Glucose reference range applies only to samples taken after fasting for at least 8 hours.   Calcium, Ion 0.98 (L) 1.15 - 1.40 mmol/L   TCO2 21 (L) 22 - 32 mmol/L   Hemoglobin 15.3 (H) 12.0 - 15.0 g/dL   HCT 54.9 63.9 - 53.9 %  Comprehensive metabolic panel with GFR     Status: Abnormal   Collection Time: 09/08/24  4:05 PM  Result Value Ref Range   Sodium 132 (L) 135 - 145 mmol/L   Potassium 4.2 3.5 - 5.1 mmol/L   Chloride 97 (L) 98 - 111 mmol/L   CO2 20 (L) 22 - 32 mmol/L   Glucose, Bld 331 (H) 70 - 99 mg/dL    Comment: Glucose reference range applies only to samples taken after fasting for at least 8 hours.   BUN 15 6 - 20 mg/dL   Creatinine, Ser 9.46 0.44 - 1.00 mg/dL   Calcium 8.9 8.9 - 89.6 mg/dL   Total Protein 7.9 6.5 - 8.1 g/dL   Albumin 4.2 3.5 - 5.0 g/dL   AST 17 15 - 41 U/L   ALT 14 0 - 44 U/L   Alkaline Phosphatase 86 38 - 126 U/L   Total Bilirubin 0.3 0.0 - 1.2 mg/dL   GFR, Estimated >39 >39 mL/min    Comment: (NOTE) Calculated using the CKD-EPI Creatinine Equation (2021)    Anion gap 15 5 - 15    Comment: Performed at [facility], [address], [place], [place] [address]  Lipase, blood     Status: None   Collection Time: 09/08/24  4:05 PM  Result Value Ref Range   Lipase 49 11 - 51 U/L    Comment: Performed at [facility], [address] [place], [place] [address]    US  Pelvis  Complete Result Date: 09/08/2024 EXAM: US  Pelvis, Complete Transvaginal and Transabdominal with Doppler 09/08/2024 TECHNIQUE: Both transabdominal and transvaginal ultrasound examinations of the pelvis were performed. Transabdominal technique was performed for global imaging of the pelvis including uterus, ovaries, adnexal regions, and pelvic cul-de-sac. It was necessary to proceed with endovaginal exam following the transabdominal exam to visualize the endometrium and ovaries bilaterally. Duplex sonography was performed using B-mode/gray scaled imaging with Doppler spectral waveform analysis and color flow obtained. COMPARISON: None available CLINICAL HISTORY: rlq pain FINDINGS: UTERUS: The uterus is anteverted. The cervix is unremarkable. No intrauterine masses are seen. The uterus measures 8.7 x 3.5 x 5.1 cm with a volume of 80.6 cc. Uterus demonstrates normal myometrial echotexture. ENDOMETRIAL STRIPE: Endometrial measures . Endometrial stripe is within normal limits. RIGHT OVARY: The right ovary is seen adjacent to, but separate from the dominant mass within the right adnexa measuring 4.2 x 2.8 x 2.1 cm with a volume of 12 cc. The ovary demonstrates normal Spectral Doppler arterial and venous waveforms. There is normal vascularity on color Doppler examination. There is, however, a clear torsion knot within the right adnexa and there is no vascularity associated with the dominant solid mass within the right adnexa which measures 7.4 x 7.8 x 6.7 cm. 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 This could be confirmed with contrast-enhanced MRI examination. LEFT OVARY: No left adnexal or ovarian mass. The left ovary measures 2.3 x 1.4 x 1.8 cm with a volume of 3.0 cc. There is normal vascularity on color Doppler examination. Spectral Doppler arterial and venous waveforms are normal. FREE FLUID: No free fluid.  IMPRESSION: 1. 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 2. Right ovary is adjacent to but separate from the mass, with normal vascularity and normal arterial and venous Doppler waveforms. Electronically signed by: Dorethia Molt MD 09/08/2024 09:28 PM EST RP Workstation: [id number]   US  Art/Ven Flow Abd Pelv Doppler Result Date: 09/08/2024 EXAM: US  Pelvis, Complete Transvaginal and Transabdominal with Doppler 09/08/2024 TECHNIQUE: Both transabdominal and transvaginal ultrasound examinations of the pelvis were performed. Transabdominal technique was performed for global imaging of the pelvis including uterus, ovaries, adnexal regions, and pelvic cul-de-sac. It was necessary to proceed with endovaginal exam following the transabdominal exam to visualize the endometrium and ovaries bilaterally. Duplex sonography was performed using B-mode/gray scaled imaging with Doppler spectral waveform analysis and color flow obtained. COMPARISON: None available CLINICAL HISTORY: rlq pain FINDINGS: UTERUS: The uterus is anteverted. The cervix is unremarkable. No intrauterine masses are seen. The uterus measures 8.7 x 3.5 x 5.1 cm with a volume of 80.6 cc. Uterus demonstrates normal myometrial echotexture. ENDOMETRIAL STRIPE: Endometrial measures . Endometrial stripe is within normal limits. RIGHT OVARY: The right ovary is seen adjacent to, but separate from the dominant mass within the right adnexa measuring 4.2 x 2.8 x 2.1 cm with a volume of 12 cc. The ovary demonstrates normal Spectral Doppler arterial and venous waveforms. There is normal vascularity on color Doppler examination. There is, however, a clear torsion knot within the right adnexa and there is no vascularity associated with the dominant solid mass within the right adnexa which measures 7.4 x 7.8 x 6.7 cm.  Given  the appearance and distinctness from the right ovary, this is favored to represent a broad ligament fibroid with torsion of the fibroid. This could be confirmed with contrast-enhanced MRI examination. LEFT OVARY: No left adnexal or ovarian mass. The left ovary measures 2.3 x 1.4 x 1.8 cm with a volume of 3.0 cc. There is normal vascularity on color Doppler examination. Spectral Doppler arterial and venous waveforms are normal. FREE FLUID: No free fluid. IMPRESSION: 1. 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 2. Right ovary is adjacent to but separate from the mass, with normal vascularity and normal arterial and venous Doppler waveforms. Electronically signed by: Dorethia Molt MD 09/08/2024 09:28 PM EST RP Workstation: [id number]   CT ABDOMEN PELVIS W CONTRAST Result Date: 09/08/2024 EXAM: CT ABDOMEN AND PELVIS WITH CONTRAST 09/08/2024 04:00:52 PM TECHNIQUE: CT of the abdomen and pelvis was performed with the administration of intravenous contrast. 75 mL of iohexol  (OMNIPAQUE ) 350 MG/ML injection was administered. Multiplanar reformatted images are provided for review. Automated exposure control, iterative reconstruction, and/or weight-based adjustment of the mA/kV was utilized to reduce the radiation dose to as low as reasonably achievable. COMPARISON: 05/29/2011 CLINICAL HISTORY: RLQ abdominal pain. FINDINGS: LOWER CHEST: No acute abnormality. LIVER: The liver is unremarkable. GALLBLADDER AND BILE DUCTS: Multiple stones within the gallbladder, the largest 2 cm. No biliary ductal dilatation. SPLEEN: No acute abnormality. PANCREAS: No acute abnormality. ADRENAL GLANDS: No acute abnormality. KIDNEYS, URETERS AND BLADDER: No stones in the kidneys or ureters. No hydronephrosis. No perinephric or periureteral stranding. Urinary bladder is unremarkable. GI AND  BOWEL: Stomach demonstrates no acute abnormality. There is no bowel obstruction. Normal appendix. PERITONEUM AND RETROPERITONEUM: Small amount of free fluid in the pelvis. No free air. VASCULATURE: Aorta is normal in caliber. LYMPH NODES: No lymphadenopathy. REPRODUCTIVE ORGANS: Large predominantly low density heterogeneous mass centrally in the pelvis measures 8.4 x 7.9 x 7.7 cm. It is difficult to determine if this is associated with the right ovary or could reflect a pedunculated fibroid off the anterior uterus. This can be further evaluated with pelvic ultrasound. BONES AND SOFT TISSUES: No acute osseous abnormality. No focal soft tissue abnormality. IMPRESSION: 1. Large predominantly low density heterogeneous pelvic mass measuring 8.4 x 7.9 x 7.7 cm, indeterminate for right ovarian origin versus pedunculated fibroid arising from the anterior uterus. Further evaluation with pelvic ultrasound is recommended. 2. Small amount of free fluid in the pelvis. 3. Cholelithiasis. Electronically signed by: Franky Crease MD 09/08/2024 06:09 PM EST RP Workstation: [id number]    Assessment/Plan: Pain may be associated with right fibroid which may be degenerating. I will refer to [facility] at [facility] for f/u and she may have ibuprofen  for pain at discharge  Lynwood Solomons 09/08/2024       [1] No Known Allergies

## 2024-09-09 ENCOUNTER — Telehealth: Payer: Self-pay | Admitting: Family Medicine

## 2024-09-09 NOTE — Telephone Encounter (Signed)
 Called patient today to schedule her follow up visit from the ED. Patient did not answer the call so I left a detailed message informing her that we do not have availability until February, to please give us  a call so that we can provide our sister office information if she'd like to be seen earlier.
